# Patient Record
Sex: Female | Born: 1960 | Race: White | Hispanic: No | Marital: Single | Smoking: Former smoker
Health system: Southern US, Community
[De-identification: ages and names within clinical notes are randomized; demographics above are authoritative.]

## PROBLEM LIST (undated history)

## (undated) DIAGNOSIS — Z789 Other specified health status: Secondary | ICD-10-CM

## (undated) DIAGNOSIS — Z720 Tobacco use: Secondary | ICD-10-CM

## (undated) DIAGNOSIS — R079 Chest pain, unspecified: Secondary | ICD-10-CM

## (undated) HISTORY — DX: Tobacco use: Z72.0

## (undated) HISTORY — DX: Other specified health status: Z78.9

## (undated) HISTORY — DX: Chest pain, unspecified: R07.9

---

## 2008-03-20 ENCOUNTER — Emergency Department: Payer: Self-pay | Admitting: Emergency Medicine

## 2008-08-24 ENCOUNTER — Ambulatory Visit: Payer: Self-pay

## 2009-10-24 ENCOUNTER — Ambulatory Visit: Payer: Self-pay

## 2014-02-20 ENCOUNTER — Emergency Department: Payer: Self-pay | Admitting: Student

## 2014-04-01 ENCOUNTER — Other Ambulatory Visit (HOSPITAL_COMMUNITY): Payer: Self-pay | Admitting: Family Medicine

## 2014-04-01 ENCOUNTER — Ambulatory Visit (HOSPITAL_COMMUNITY)
Admission: RE | Admit: 2014-04-01 | Discharge: 2014-04-01 | Disposition: A | Discharge status: Home / Self Care | Payer: Disability Insurance | Source: Ambulatory Visit | Attending: Family Medicine | Admitting: Family Medicine

## 2014-04-01 DIAGNOSIS — M549 Dorsalgia, unspecified: Secondary | ICD-10-CM | POA: Diagnosis present

## 2014-04-01 DIAGNOSIS — M5136 Other intervertebral disc degeneration, lumbar region: Secondary | ICD-10-CM | POA: Insufficient documentation

## 2014-08-28 ENCOUNTER — Emergency Department
Admit: 2014-08-28 | Disposition: A | Discharge status: Home / Self Care | Payer: Self-pay | Admitting: Emergency Medicine

## 2016-03-27 IMAGING — CR DG LUMBAR SPINE 2-3V
3 series · 3 of 3 positions shown · non-contrast
Comparison: None.

CLINICAL DATA: Bilateral back pain, unspecified location.

EXAM:
LUMBAR SPINE - 2-3 VIEW

[view not recorded (1 of 3)]
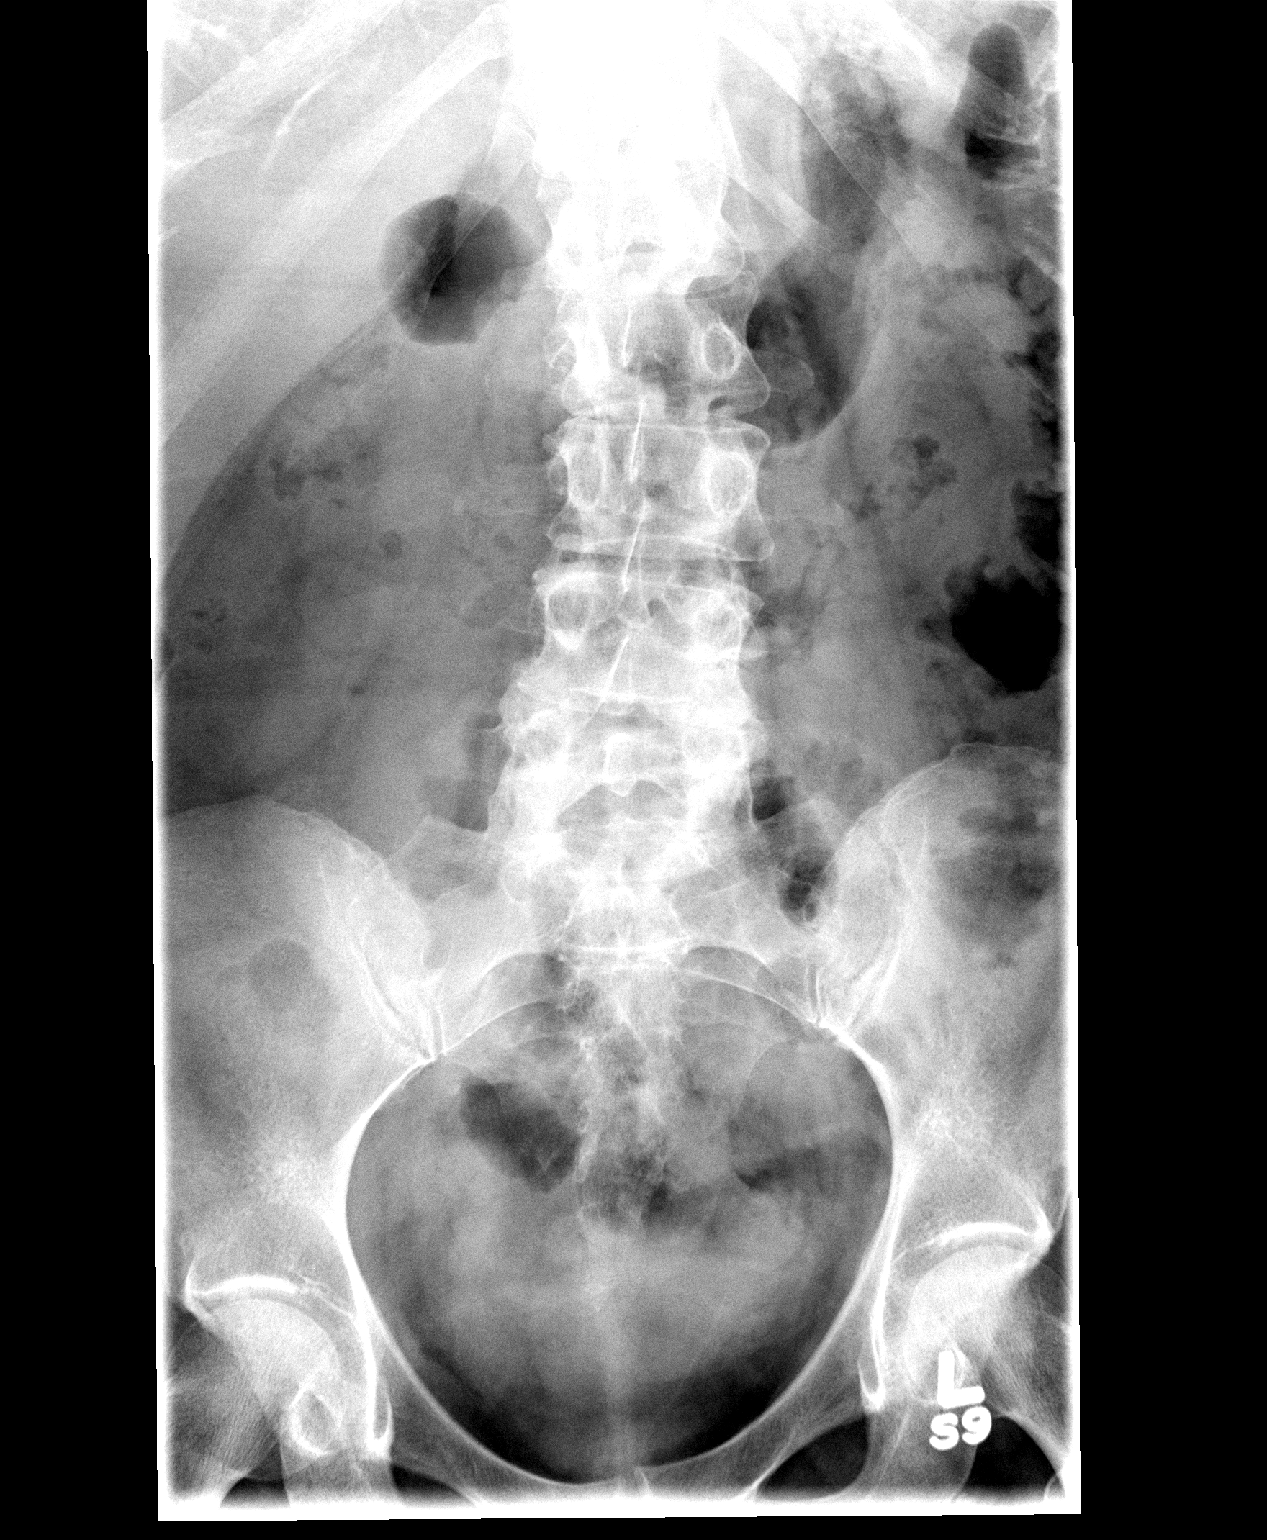

[view not recorded (2 of 3)]
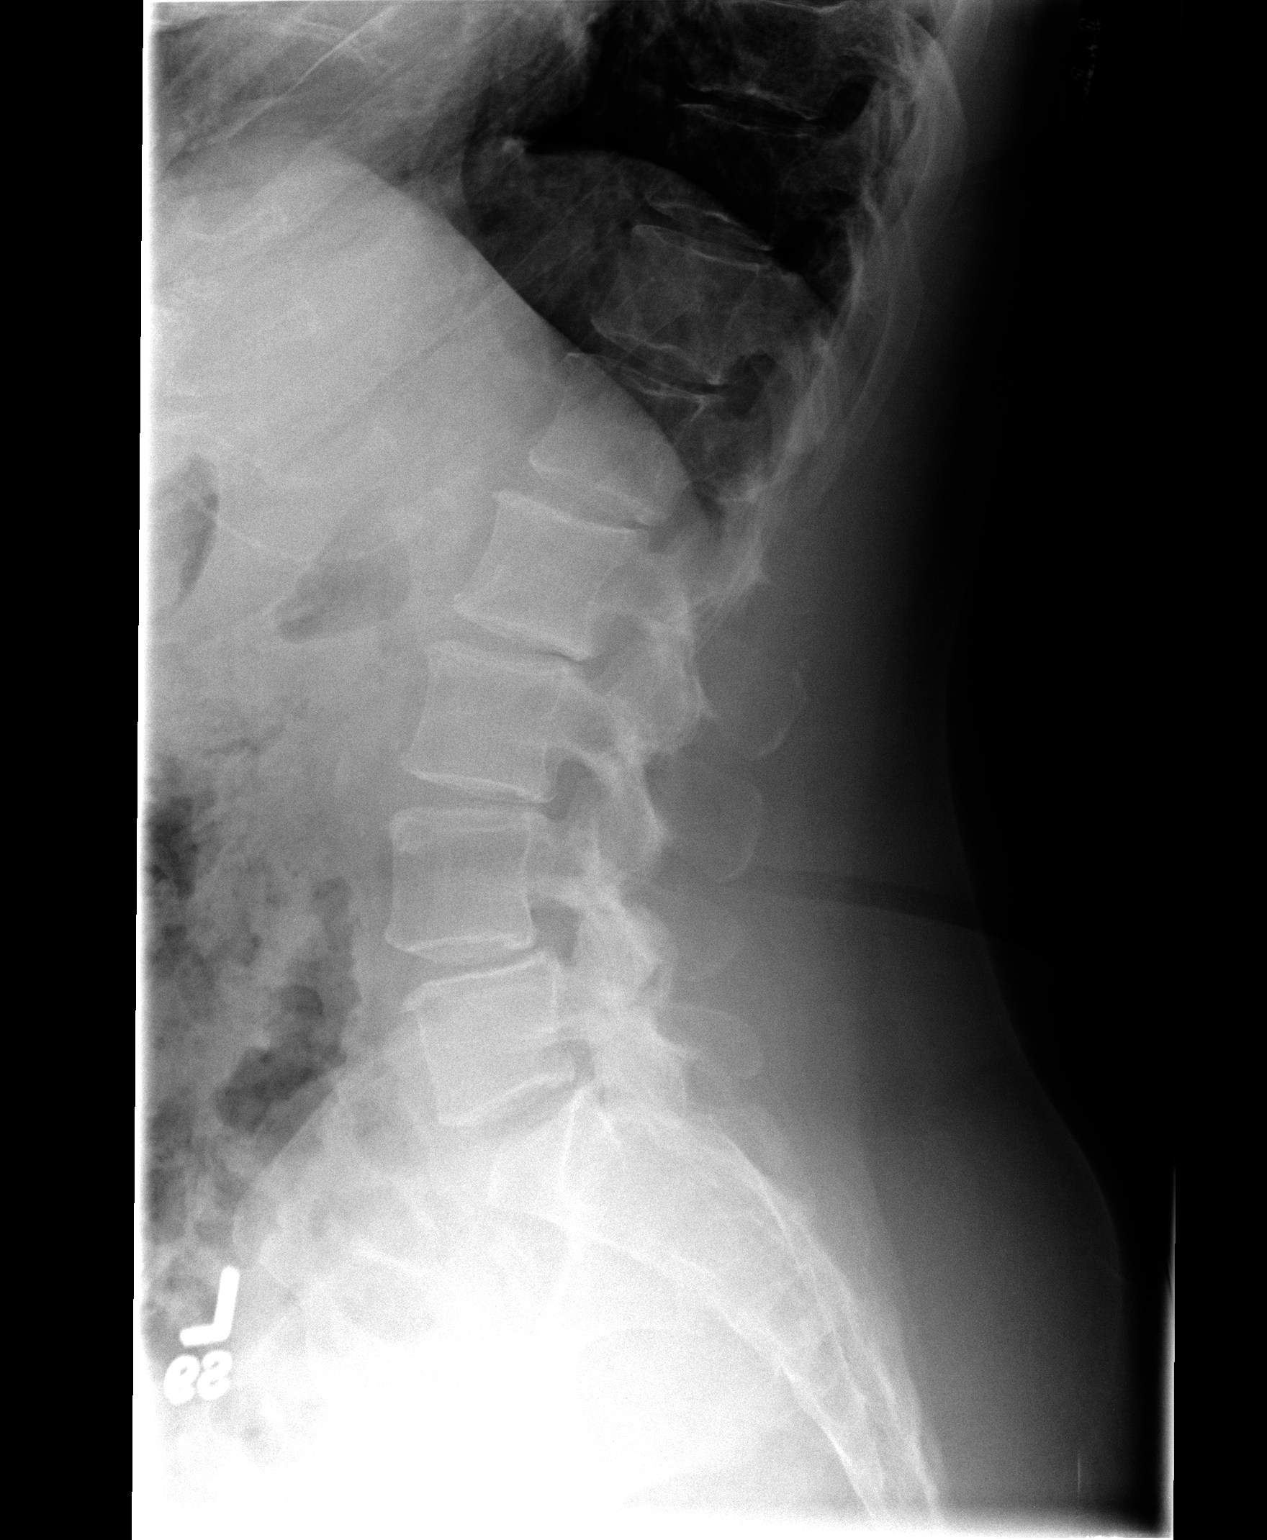

[view not recorded (3 of 3)]
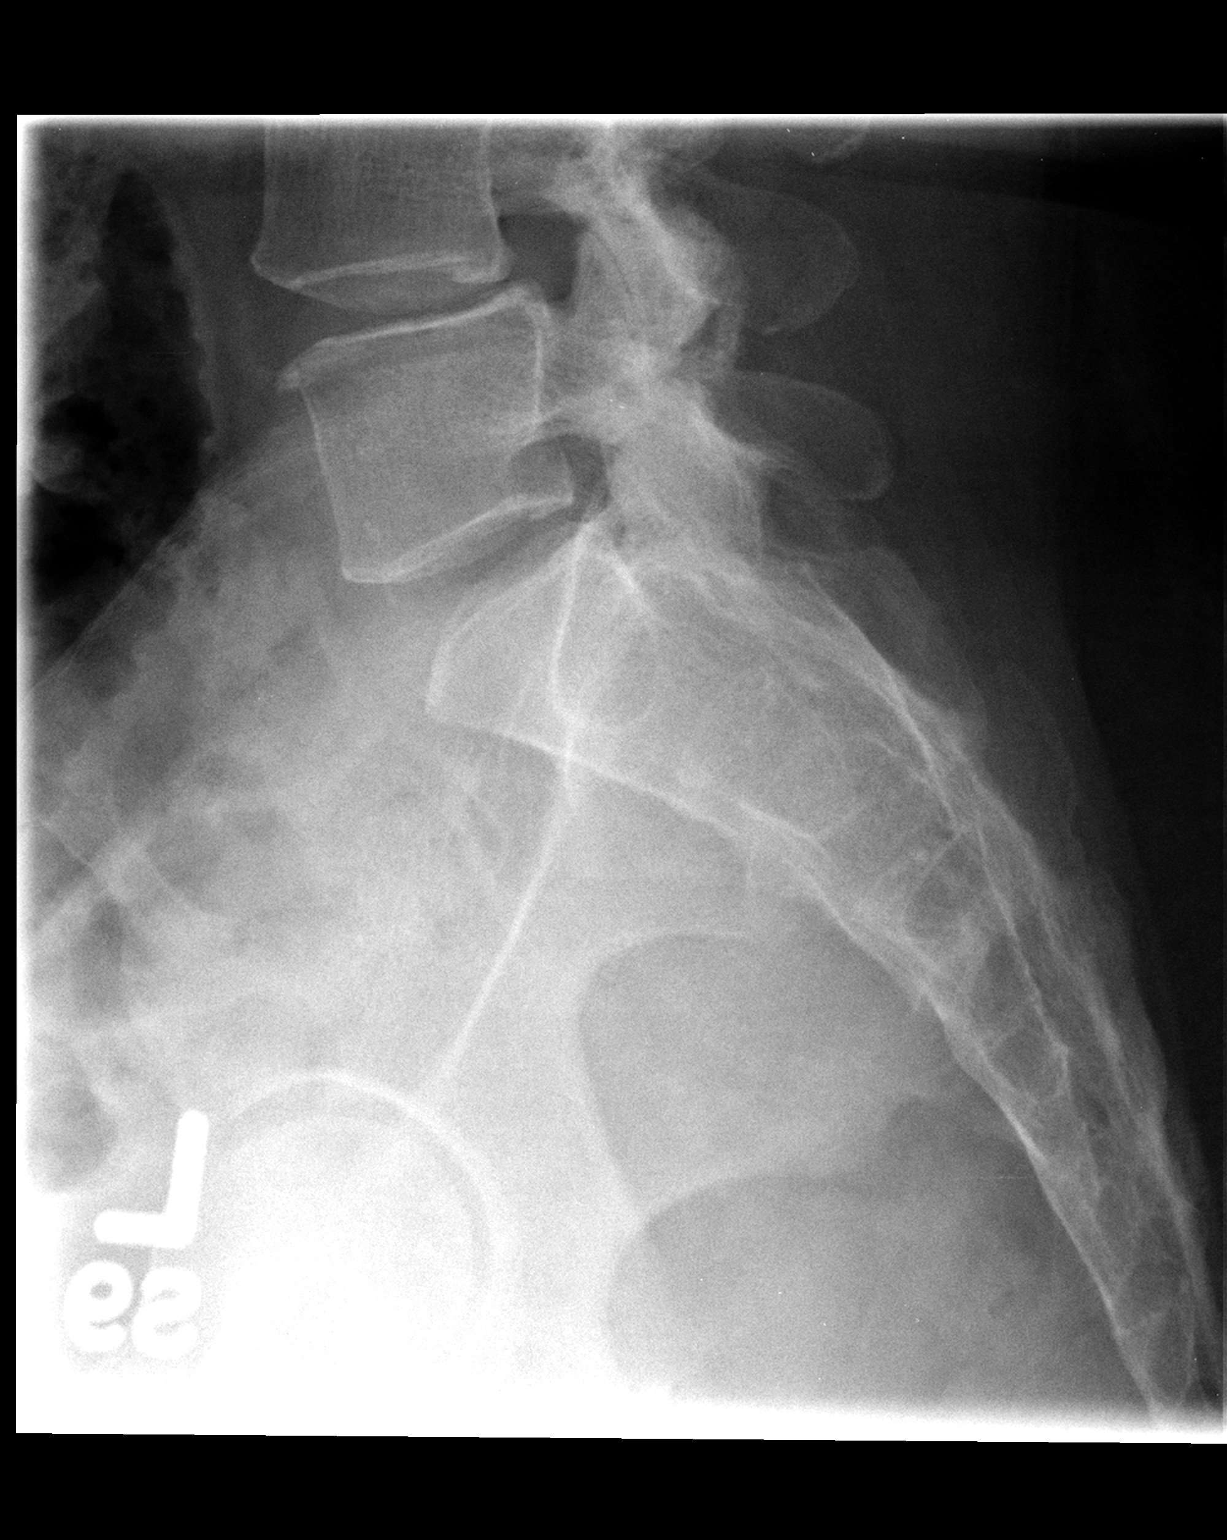

[3 of 3 positions shown; findings below may reference images not displayed]

FINDINGS: Moderate levoscoliosis of lumbar spine is noted. No fracture is
noted. Mild degenerative disc disease is noted at L2-3, L3-4 and
L4-5. Minimal grade 1 anterolisthesis of L4-5 is noted secondary to
posterior facet joint hypertrophy.
IMPRESSION: Mild multilevel degenerative disc disease. No acute abnormality seen
in the lumbar spine.

## 2019-03-29 ENCOUNTER — Emergency Department: Payer: Self-pay

## 2019-03-29 ENCOUNTER — Encounter: Payer: Self-pay | Admitting: Emergency Medicine

## 2019-03-29 ENCOUNTER — Emergency Department
Admission: EM | Admit: 2019-03-29 | Discharge: 2019-03-29 | Disposition: A | Discharge status: Home / Self Care | Payer: Self-pay | Attending: Emergency Medicine | Admitting: Emergency Medicine

## 2019-03-29 ENCOUNTER — Other Ambulatory Visit: Payer: Self-pay

## 2019-03-29 DIAGNOSIS — R0789 Other chest pain: Secondary | ICD-10-CM | POA: Insufficient documentation

## 2019-03-29 DIAGNOSIS — F17290 Nicotine dependence, other tobacco product, uncomplicated: Secondary | ICD-10-CM | POA: Insufficient documentation

## 2019-03-29 LAB — CBC
HCT: 42.9 % (ref 36.0–46.0)
Hemoglobin: 14.2 g/dL (ref 12.0–15.0)
MCH: 29.8 pg (ref 26.0–34.0)
MCHC: 33.1 g/dL (ref 30.0–36.0)
MCV: 90.1 fL (ref 80.0–100.0)
Platelets: 265 10*3/uL (ref 150–400)
RBC: 4.76 MIL/uL (ref 3.87–5.11)
RDW: 12.5 % (ref 11.5–15.5)
WBC: 8.5 10*3/uL (ref 4.0–10.5)
nRBC: 0 % (ref 0.0–0.2)

## 2019-03-29 LAB — BASIC METABOLIC PANEL
Anion gap: 13 (ref 5–15)
BUN: 28 mg/dL — ABNORMAL HIGH (ref 6–20)
CO2: 26 mmol/L (ref 22–32)
Calcium: 9.5 mg/dL (ref 8.9–10.3)
Chloride: 102 mmol/L (ref 98–111)
Creatinine, Ser: 0.91 mg/dL (ref 0.44–1.00)
GFR calc Af Amer: >60 mL/min (ref >60)
GFR calc non Af Amer: >60 mL/min (ref >60)
Glucose, Bld: 139 mg/dL — ABNORMAL HIGH (ref 70–99)
Potassium: 3.8 mmol/L (ref 3.5–5.1)
Sodium: 141 mmol/L (ref 135–145)

## 2019-03-29 LAB — TROPONIN I (HIGH SENSITIVITY): Troponin I (High Sensitivity): <2 ng/L (ref <18)

## 2019-03-29 MED ORDER — SODIUM CHLORIDE 0.9% FLUSH: 3.0000 mL | Freq: Once | INTRAVENOUS | Status: DC

## 2019-03-29 NOTE — ED Notes (Signed)
ED Provider at bedside. 

## 2019-03-29 NOTE — ED Triage Notes (Signed)
C/O left sided chest pain, intermittent pain.  X 1 day.  Describes pain as a grabbing pain.  AAOx3.  Skin warm and dry.  No SOB/ DOE.  NAD

## 2019-03-29 NOTE — ED Provider Notes (Signed)
[facility] Emergency Department Provider Note   ____________________________________________    I have reviewed the triage vital signs and the nursing notes.   HISTORY  Chief Complaint Chest Pain     HPI Nancy Harrington is a 58 y.o. female who presents with complaints of chest pain.  Patient describes 1 week of intermittent chest discomfort in her left superior anterior chest near her shoulder.  She describes it as a pinching pain.  No pleurisy.  No shortness of breath.  No cough fevers or chills.  Has never had this before.  No history of heart disease, did smoke in the past.  Has not take anything for this  History reviewed. No pertinent past medical history.  There are no active problems to display for this patient.   History reviewed. No pertinent surgical history.  Prior to Admission medications   Not on File     Allergies Patient has no known allergies.  No family history on file.  Social History Social History   Tobacco Use  . Smoking status: Current Every Day Smoker    Types: E-cigarettes  . Smokeless tobacco: Never Used  Substance Use Topics  . Alcohol use: Not Currently  . Drug use: Not Currently    Review of Systems  Constitutional: No fever/chills Eyes: No visual changes.  ENT: No sore throat. Cardiovascular: As above Respiratory: Denies shortness of breath. Gastrointestinal: No abdominal pain.   Genitourinary: Negative for dysuria. Musculoskeletal: Negative for back pain. Skin: Negative for rash. Neurological: Negative for headaches or weakness   ____________________________________________   PHYSICAL EXAM:  VITAL SIGNS: ED Triage Vitals  Enc Vitals Group     BP 03/29/19 1825 (!) 142/85     Pulse Rate 03/29/19 1825 81     Resp 03/29/19 1825 16     Temp 03/29/19 1825 98.1 F (36.7 C)     Temp Source 03/29/19 1825 Oral     SpO2 03/29/19 1825 97 %     Weight 03/29/19 1822 68 kg (150 lb)     Height  03/29/19 1822 1.549 m (5\' 1" )     Head Circumference --      Peak Flow --      Pain Score 03/29/19 1821 5     Pain Loc --      Pain Edu? --      Excl. in [place]? --     Constitutional: Alert and oriented.  Mouth/Throat: Mucous membranes are moist.   Neck:  Painless ROM Cardiovascular: Normal rate, regular rhythm. Grossly normal heart sounds.  Good peripheral circulation.  Mild chest wall tenderness to palpation, equal pulses upper extremities Respiratory: Normal respiratory effort.  No retractions. Lungs CTAB. Gastrointestinal: Soft and nontender. No distention.  No CVA tenderness. Genitourinary: deferred Musculoskeletal: No lower extremity tenderness nor edema.  Warm and well perfused Neurologic:  Normal speech and language. No gross focal neurologic deficits are appreciated.  Skin:  Skin is warm, dry and intact. No rash noted. Psychiatric: Mood and affect are normal. Speech and behavior are normal.  ____________________________________________   LABS (all labs ordered are listed, but only abnormal results are displayed)  Labs Reviewed  BASIC METABOLIC PANEL - Abnormal; Notable for the following components:      Result Value   Glucose, Bld 139 (*)    BUN 28 (*)    All other components within normal limits  CBC  POC URINE PREG, ED  TROPONIN I (HIGH SENSITIVITY)   ____________________________________________  EKG  ED ECG REPORT I, Lavonia Drafts, the attending physician, personally viewed and interpreted this ECG.  Date: 03/29/2019  Rhythm: normal sinus rhythm QRS Axis: normal Intervals: normal ST/T Wave abnormalities: normal Narrative Interpretation: no evidence of acute ischemia  ____________________________________________  RADIOLOGY  Chest x-ray unremarkable ____________________________________________   PROCEDURES  Procedure(s) performed: No  Procedures   Critical Care performed: No ____________________________________________   INITIAL IMPRESSION  / ASSESSMENT AND PLAN / ED COURSE  Pertinent labs & imaging results that were available during my care of the patient were reviewed by me and considered in my medical decision making (see chart for details).  Patient presents with chest discomfort as described above, no pain currently.  EKG is reassuring.  Troponin is normal.  Lab work is unremarkable.  Chest x-ray is benign.  Symptoms not consistent with ACS PE or dissection.  She is asymptomatic currently.  Appropriate for discharge at this time we will have her follow-up with cardiology.    ____________________________________________   FINAL CLINICAL IMPRESSION(S) / ED DIAGNOSES  Final diagnoses:  Atypical chest pain        Note:  This document was prepared using Dragon voice recognition software and may include unintentional dictation errors.   Lavonia Drafts, MD 03/29/19 2059

## 2019-04-05 ENCOUNTER — Encounter: Payer: Self-pay | Admitting: Cardiology

## 2019-04-05 ENCOUNTER — Ambulatory Visit (INDEPENDENT_AMBULATORY_CARE_PROVIDER_SITE_OTHER): Payer: Self-pay | Admitting: Cardiology

## 2019-04-05 ENCOUNTER — Other Ambulatory Visit: Payer: Self-pay

## 2019-04-05 VITALS — BP 120/80 | HR 82 | Temp 97.4°F | Ht 61.0 in | Wt 152.2 lb

## 2019-04-05 DIAGNOSIS — R079 Chest pain, unspecified: Secondary | ICD-10-CM

## 2019-04-05 NOTE — Progress Notes (Signed)
Cardiology Office Note:    Date:  04/05/2019   ID:  Nancy Harrington, DOB [birth year], MRN [id number]  PCP:  System, Provider Not In  Cardiologist:  Kate Sable, MD  Electrophysiologist:  None   Referring MD: Lavonia Drafts, MD   Chief Complaint  Patient presents with   New Patient (Initial Visit)    Intermittent chest pain-Patient reports restless legs; No meds.    History of Present Illness:    Nancy Harrington is a 58 y.o. female with tobacco smoker x30 years, currently uses e-cigarettes, who presents as a follow-up from the emergency department due to chest pain.  Patient was seen in the emergency room about a week ago for symptoms of chest pain.  Pain has been ongoing for 2 weeks now and she describes it astightness in her left chest.  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  She went home thinking the pain will improve and it slowly is still way and she went to sleep.  When she woke up she denies any symptoms of chest pain while she was getting ready to go to work, the symptoms of left chest discomfort resumed which prompted her to go to the emergency room.  She denies any shortness of breath, palpitations, orthopnea, presyncope or syncope.  In the emergency room, chest x-ray was unremarkable, EKG was normal.  High-sensitivity troponin was normal.  Patient was then advised to follow-up with cardiology.  She states she has not really experienced the symptoms since.  Patient has not seen a physician in about 7 years now.  She is not sure if she has diabetes or hyperlipidemia.  History reviewed. No pertinent past medical history.  Past Surgical History:  Procedure Laterality Date   CESAREAN SECTION      Current Medications: No outpatient medications have been marked as taking for the 04/05/19 encounter (Office Visit) with Kate Sable, MD.     Allergies:   Patient has no known allergies.   Social History   Socioeconomic History   Marital  status: Married    Spouse name: Not on file   Number of children: Not on file   Years of education: Not on file   Highest education level: Not on file  Occupational History   Not on file  Social Needs   Financial resource strain: Not on file   Food insecurity    Worry: Not on file    Inability: Not on file   Transportation needs    Medical: Not on file    Non-medical: Not on file  Tobacco Use   Smoking status: Former Smoker    Types: E-cigarettes, Cigarettes   Smokeless tobacco: Never Used  Substance and Sexual Activity   Alcohol use: Not Currently   Drug use: Not Currently   Sexual activity: Not on file  Lifestyle   Physical activity    Days per week: Not on file    Minutes per session: Not on file   Stress: Not on file  Relationships   Social connections    Talks on phone: Not on file    Gets together: Not on file    Attends religious service: Not on file    Active member of club or organization: Not on file    Attends meetings of clubs or organizations: Not on file    Relationship status: Not on file  Other Topics Concern   Not on file  Social History Narrative   Not on file  Family History: The patient's family history includes Diabetes in her mother; Heart disease in her mother; Hypertension in her brother and father.  ROS:   Please see the history of present illness.     All other systems reviewed and are negative.  EKGs/Labs/Other Studies Reviewed:    The following studies were reviewed today:   EKG:  EKG is  ordered today.  The ekg ordered today demonstrates sinus rhythm, cannot rule out anterior infarct.  Recent Labs: 03/29/2019: BUN 28; Creatinine, Ser 0.91; Hemoglobin 14.2; Platelets 265; Potassium 3.8; Sodium 141  Recent Lipid Panel No results found for: CHOL, TRIG, HDL, CHOLHDL, VLDL, LDLCALC, LDLDIRECT  Physical Exam:    VS:  BP 120/80 (BP Location: Right Arm, Patient Position: Sitting, Cuff Size: Normal)    Pulse 82     Temp (!) 97.4 F (36.3 C)    Ht 5\' 1"  (1.549 m)    Wt 152 lb 4 oz (69.1 kg)    SpO2 97%    BMI 28.77 kg/m     Wt Readings from Last 3 Encounters:  04/05/19 152 lb 4 oz (69.1 kg)  03/29/19 150 lb (68 kg)     GEN:  Well nourished, well developed in no acute distress HEENT: Normal NECK: No JVD; No carotid bruits LYMPHATICS: No lymphadenopathy CARDIAC: RRR, no murmurs, rubs, gallops RESPIRATORY:  Clear to auscultation without rales, wheezing or rhonchi  ABDOMEN: Soft, non-tender, non-distended MUSCULOSKELETAL:  No edema; No deformity  SKIN: Warm and dry NEUROLOGIC:  Alert and oriented x 3 PSYCHIATRIC:  Normal affect   ASSESSMENT:   Patient with atypical chest pain.  Has a long history of smoking.  Has not seen a physician in about 7 years.  Other risk factors of diabetes or hyperlipidemia indeterminate. 1. Chest pain, unspecified type    PLAN:    In order of problems listed above:  1. We will get a Lexiscan myocardial perfusion imaging stress test.  Follow-up after Lexiscan.  Total encounter time more than 45 minutes  Greater than 50% was spent in counseling and coordination of care with the patient  This note was generated in part or whole with voice recognition software. Voice recognition is usually quite accurate but there are transcription errors that can and very often do occur. I apologize for any typographical errors that were not detected and corrected.  Medication Adjustments/Labs and Tests Ordered: Current medicines are reviewed at length with the patient today.  Concerns regarding medicines are outlined above.  Orders Placed This Encounter  Procedures   NM Myocar Multi W/Spect W/Wall Motion / EF   Pregnancy, urine   EKG 12-Lead   No orders of the defined types were placed in this encounter.   There are no Patient Instructions on file for this visit.   Signed, Kate Sable, MD  04/05/2019 4:46 PM    [place]

## 2019-04-05 NOTE — Addendum Note (Signed)
Addended by: Vanessa Ralphs on: 04/05/2019 05:13 PM   Modules accepted: Orders

## 2019-04-05 NOTE — Patient Instructions (Addendum)
Medication Instructions:  Your physician recommends that you continue on your current medications as directed. Please refer to the Current Medication list given to you today.  *If you need a refill on your cardiac medications before your next appointment, please call your pharmacy*  Lab Work: none If you have labs (blood work) drawn today and your tests are completely normal, you will receive your results only by: Marland Kitchen MyChart Message (if you have MyChart) OR . A paper copy in the mail If you have any lab test that is abnormal or we need to change your treatment, we will call you to review the results.  Testing/Procedures: Your physician has requested that you have a lexiscan myoview. For further information please visit [email or web address]. Please follow instruction sheet, as given.  [place]  Your caregiver has ordered a Stress Test with nuclear imaging. The purpose of this test is to evaluate the blood supply to your heart muscle. This procedure is referred to as a "Non-Invasive Stress Test." This is because other than having an IV started in your vein, nothing is inserted or "invades" your body. Cardiac stress tests are done to find areas of poor blood flow to the heart by determining the extent of coronary artery disease (CAD). Some patients exercise on a treadmill, which naturally increases the blood flow to your heart, while others who are  unable to walk on a treadmill due to physical limitations have a pharmacologic/chemical stress agent called Lexiscan . This medicine will mimic walking on a treadmill by temporarily increasing your coronary blood flow.   Please note: these test may take anywhere between 2-4 hours to complete  PLEASE REPORT TO [place] AT THE FIRST DESK WILL DIRECT YOU WHERE TO GO  Date of Procedure:_____________________________________  Arrival Time for Procedure:______________________________   PLEASE NOTIFY THE OFFICE AT  LEAST 24 HOURS IN ADVANCE IF YOU ARE UNABLE TO KEEP YOUR APPOINTMENT.  [phone] AND  PLEASE NOTIFY NUCLEAR MEDICINE AT [facility] AT LEAST 24 HOURS IN ADVANCE IF YOU ARE UNABLE TO KEEP YOUR APPOINTMENT. [phone]  How to prepare for your Myoview test:  1. Do not eat or drink after midnight 2. No caffeine for 24 hours prior to test 3. No smoking 24 hours prior to test. 4. Your medication may be taken with water.  If your doctor stopped a medication because of this test, do not take that medication. 5. Ladies, please do not wear dresses.  Skirts or pants are appropriate. Please wear a short sleeve shirt. 6. No perfume, cologne or lotion. 7. Wear comfortable walking shoes. No heels!  Follow-Up: At [facility], you and your health needs are our priority.  As part of our continuing mission to provide you with exceptional heart care, we have created designated Provider Care Teams.  These Care Teams include your primary Cardiologist (physician) and Advanced Practice Providers (APPs -  Physician Assistants and Nurse Practitioners) who all work together to provide you with the care you need, when you need it.  Your next appointment:   4-5 weeks.  The format for your next appointment:   In Person  Provider:    You may see Kate Sable, MD or one of the following Advanced Practice Providers on your designated Care Team:    Murray Hodgkins, NP  Christell Faith, PA-C  Marrianne Mood, PA-C   Cardiac Nuclear Scan A cardiac nuclear scan is a test that measures blood flow to the heart when a person is resting and  when he or she is exercising. The test looks for problems such as:  Not enough blood reaching a portion of the heart.  The heart muscle not working normally. You may need this test if:  You have heart disease.  You have had abnormal lab results.  You have had heart surgery or a balloon procedure to open up blocked arteries (angioplasty).  You have chest pain.  You  have shortness of breath. In this test, a radioactive dye (tracer) is injected into your bloodstream. After the tracer has traveled to your heart, an imaging device is used to measure how much of the tracer is absorbed by or distributed to various areas of your heart. This procedure is usually done at a hospital and takes 2-4 hours. Tell a health care provider about:  Any allergies you have.  All medicines you are taking, including vitamins, herbs, eye drops, creams, and over-the-counter medicines.  Any problems you or family members have had with anesthetic medicines.  Any blood disorders you have.  Any surgeries you have had.  Any medical conditions you have.  Whether you are pregnant or may be pregnant. What are the risks? Generally, this is a safe procedure. However, problems may occur, including:  Serious chest pain and heart attack. This is only a risk if the stress portion of the test is done.  Rapid heartbeat.  Sensation of warmth in your chest. This usually passes quickly.  Allergic reaction to the tracer. What happens before the procedure?  Ask your health care provider about changing or stopping your regular medicines. This is especially important if you are taking diabetes medicines or blood thinners.  Follow instructions from your health care provider about eating or drinking restrictions.  Remove your jewelry on the day of the procedure. What happens during the procedure?  An IV will be inserted into one of your veins.  Your health care provider will inject a small amount of radioactive tracer through the IV.  You will wait for 20-40 minutes while the tracer travels through your bloodstream.  Your heart activity will be monitored with an electrocardiogram (ECG).  You will lie down on an exam table.  Images of your heart will be taken for about 15-20 minutes.  You may also have a stress test. For this test, one of the following may be done: ? You will  exercise on a treadmill or stationary bike. While you exercise, your heart's activity will be monitored with an ECG, and your blood pressure will be checked. ? You will be given medicines that will increase blood flow to parts of your heart. This is done if you are unable to exercise.  When blood flow to your heart has peaked, a tracer will again be injected through the IV.  After 20-40 minutes, you will get back on the exam table and have more images taken of your heart.  Depending on the type of tracer used, scans may need to be repeated 3-4 hours later.  Your IV line will be removed when the procedure is over. The procedure may vary among health care providers and hospitals. What happens after the procedure?  Unless your health care provider tells you otherwise, you may return to your normal schedule, including diet, activities, and medicines.  Unless your health care provider tells you otherwise, you may increase your fluid intake. This will help to flush the contrast dye from your body. Drink enough fluid to keep your urine pale yellow.  Ask your health care  provider, or the department that is doing the test: ? When will my results be ready? ? How will I get my results? Summary  A cardiac nuclear scan measures the blood flow to the heart when a person is resting and when he or she is exercising.  Tell your health care provider if you are pregnant.  Before the procedure, ask your health care provider about changing or stopping your regular medicines. This is especially important if you are taking diabetes medicines or blood thinners.  After the procedure, unless your health care provider tells you otherwise, increase your fluid intake. This will help flush the contrast dye from your body.  After the procedure, unless your health care provider tells you otherwise, you may return to your normal schedule, including diet, activities, and medicines. This information is not intended to  replace advice given to you by your health care provider. Make sure you discuss any questions you have with your health care provider. Document Released: 06/07/2004 Document Revised: 10/27/2017 Document Reviewed: 10/27/2017 Elsevier Patient Education  2020 [employer].

## 2019-04-13 ENCOUNTER — Encounter
Admission: RE | Admit: 2019-04-13 | Discharge: 2019-04-13 | Disposition: A | Discharge status: Home / Self Care | Payer: Self-pay | Source: Ambulatory Visit | Attending: Cardiology | Admitting: Cardiology

## 2019-04-13 ENCOUNTER — Other Ambulatory Visit: Payer: Self-pay

## 2019-04-13 DIAGNOSIS — R079 Chest pain, unspecified: Secondary | ICD-10-CM

## 2019-04-13 MED ORDER — TECHNETIUM TC 99M TETROFOSMIN IV KIT
10.0000 | PACK | Freq: Once | INTRAVENOUS | Status: AC | PRN
  Administered 2019-04-13: 10.222 via INTRAVENOUS

## 2019-04-13 MED ORDER — REGADENOSON 0.4 MG/5ML IV SOLN
0.4000 mg | Freq: Once | INTRAVENOUS | Status: AC
  Administered 2019-04-13: 0.4 mg via INTRAVENOUS

## 2019-04-13 MED ORDER — TECHNETIUM TC 99M TETROFOSMIN IV KIT
30.0000 | PACK | Freq: Once | INTRAVENOUS | Status: AC | PRN
  Administered 2019-04-13: 30.821 via INTRAVENOUS

## 2019-04-14 LAB — NM MYOCAR MULTI W/SPECT W/WALL MOTION / EF
LV dias vol: 39 mL (ref 46–106)
LV sys vol: 11 mL
MPHR: 161 {beats}/min
Peak HR: 108 {beats}/min
Percent HR: 67 %
Rest HR: 61 {beats}/min
TID: 0.94

## 2019-04-15 ENCOUNTER — Telehealth: Payer: Self-pay

## 2019-04-15 NOTE — Telephone Encounter (Signed)
Called to give the patient stress test results. Unable to lmom patients voiced mail is full.

## 2019-04-19 NOTE — Telephone Encounter (Signed)
2nd attempt to contact the patient. Unable to lmom. Patients voicemail is full.

## 2019-04-20 NOTE — Telephone Encounter (Signed)
-----   Message from Kate Sable, MD sent at 04/15/2019  4:11 PM EST ----- Normal test.

## 2019-04-20 NOTE — Telephone Encounter (Signed)
3rd attempt to contact the patient with results. Unable to lmom. Letter mailed for the patient to contact the office for stress test results.

## 2019-05-03 NOTE — Telephone Encounter (Signed)
Incoming call from patient to review results of stress test. No further orders or questions at this time.   Pt asked to delay appt d/t work schedule. Changed appt to 16th.   Advised pt to call for any further questions or concerns.

## 2019-05-11 ENCOUNTER — Ambulatory Visit: Payer: Self-pay | Admitting: Cardiology

## 2019-05-12 ENCOUNTER — Ambulatory Visit: Payer: Self-pay | Admitting: Nurse Practitioner

## 2019-06-09 ENCOUNTER — Encounter: Payer: Self-pay | Admitting: Nurse Practitioner

## 2019-06-09 ENCOUNTER — Ambulatory Visit (INDEPENDENT_AMBULATORY_CARE_PROVIDER_SITE_OTHER): Payer: Self-pay | Admitting: Nurse Practitioner

## 2019-06-09 ENCOUNTER — Other Ambulatory Visit: Payer: Self-pay

## 2019-06-09 VITALS — BP 138/88 | HR 77 | Ht 61.0 in | Wt 153.2 lb

## 2019-06-09 DIAGNOSIS — R0789 Other chest pain: Secondary | ICD-10-CM

## 2019-06-09 DIAGNOSIS — R030 Elevated blood-pressure reading, without diagnosis of hypertension: Secondary | ICD-10-CM

## 2019-06-09 NOTE — Patient Instructions (Signed)
Medication Instructions:  None ordered  *If you need a refill on your cardiac medications before your next appointment, please call your pharmacy*  Lab Work: None ordered  If you have labs (blood work) drawn today and your tests are completely normal, you will receive your results only by: Marland Kitchen MyChart Message (if you have MyChart) OR . A paper copy in the mail If you have any lab test that is abnormal or we need to change your treatment, we will call you to review the results.  Testing/Procedures: None ordered   Follow-Up: At [facility], you and your health needs are our priority.  As part of our continuing mission to provide you with exceptional heart care, we have created designated Provider Care Teams.  These Care Teams include your primary Cardiologist (physician) and Advanced Practice Providers (APPs -  Physician Assistants and Nurse Practitioners) who all work together to provide you with the care you need, when you need it.  Your next appointment:  As needed

## 2019-06-09 NOTE — Progress Notes (Signed)
Office Visit    Patient Name: Nancy Harrington Date of Encounter: 06/09/2019  Primary Care Provider:  System, Provider Not In Primary Cardiologist:  Kate Sable, MD  Chief Complaint    59 year old female with a history of chest pain and tobacco abuse, who presents for follow-up after recent normal stress test.  Past Medical History    Past Medical History:  Diagnosis Date  . Chest pain    a. 03/2019 MV: EF >65%. No ischemia/infarct. No significant cor Ca2+.  . Electronic cigarette use   . Tobacco abuse    Past Surgical History:  Procedure Laterality Date  . CESAREAN SECTION      Allergies  No Known Allergies  History of Present Illness    59 year old female with a 30-pack-year history of tobacco abuse currently using e-cigarettes, who was recently evaluated following emergency room visit for chest pain.  Discomfort was described as a tightness in the left chest and was intermittent for 2 weeks at the time of her ER visit.  There, chest x-ray, ECG, and lab work was unremarkable and she was discharged.  She had no recurrent symptoms between her ER visit and cardiology visit on November 9.  Stress testing was subsequently undertaken and was negative for infarct/ischemia, with normal LV function, and without any significant coronary calcium.  Since her last visit, she has not had any recurrent chest pain.  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  She thinks that perhaps anxiety was causing her symptoms previously.  She does not currently have a primary care doctor and is interested in a referral.  She denies dyspnea, palpitations, PND, orthopnea, dizziness, syncope, edema, or early satiety.  Home Medications    None     Review of Systems    She feels anxious.  She denies chest pain, palpitations, dyspnea, pnd, orthopnea, n, v, dizziness, syncope, edema, weight gain, or early satiety.  All  other systems reviewed and are otherwise negative except as noted above.  Physical Exam    VS:  BP 138/88   Pulse 77   Ht 5\' 1"  (1.549 m)   Wt 153 lb 4 oz (69.5 kg)   SpO2 98%   BMI 28.96 kg/m  , BMI Body mass index is 28.96 kg/m. GEN: Well nourished, well developed, in no acute distress. HEENT: normal. Neck: Supple, no JVD, carotid bruits, or masses. Cardiac: RRR, no murmurs, rubs, or gallops. No clubbing, cyanosis, edema.  Radials/DP/PT 2+ and equal bilaterally.  Respiratory:  Respirations regular and unlabored, clear to auscultation bilaterally. GI: Soft, nontender, nondistended, BS + x 4. MS: no deformity or atrophy. Skin: warm and dry, no rash. Neuro:  Strength and sensation are intact. Psych: Normal affect.  Accessory Clinical Findings    ECG personally reviewed by me today -regular sinus rhythm, 77, left axis - no acute changes.  Lab Results  Component Value Date   WBC 8.5 03/29/2019   HGB 14.2 03/29/2019   HCT 42.9 03/29/2019   MCV 90.1 03/29/2019   PLT 265 03/29/2019   Lab Results  Component Value Date   CREATININE 0.91 03/29/2019   BUN 28 (H) 03/29/2019   NA 141 03/29/2019   K 3.8 03/29/2019   CL 102 03/29/2019   CO2 26 03/29/2019    Assessment & Plan    1.  Atypical chest pain: Patient recently evaluated in the emergency department secondary to atypical chest  pain with unremarkable work-up.  She subsequently underwent stress testing which was nonischemic and showed normal LV function.  Also no significant coronary calcium.  She has had no recurrent symptoms since her ER visit.  Reassurance provided.  No further cardiac work-up warranted.  We have provided her with information to obtain a primary care provider.  2.  Elevated blood pressure: Blood pressure mildly elevated today.  She does own a blood pressure cuff and I recommended that she check her blood pressure regularly and document findings for when she follows up with primary care.  She says that when  she was checking regularly in the past pressures were typically in the 1 teens.  3.  Anxiety: Patient says that she feels on edge and anxious at times and thinks that this was likely contributing to chest pain symptoms previously.  As above, referral to primary care.  4.  E-cigarette usage:  Cessation advised.  5.  Disposition: Follow-up with cardiology as needed.   Murray Hodgkins, NP 06/09/2019, 12:25 PM

## 2020-10-25 ENCOUNTER — Other Ambulatory Visit: Payer: Self-pay | Admitting: Family Medicine

## 2020-10-25 DIAGNOSIS — Z1231 Encounter for screening mammogram for malignant neoplasm of breast: Secondary | ICD-10-CM

## 2020-11-07 ENCOUNTER — Encounter: Payer: Self-pay | Admitting: Family Medicine

## 2020-11-09 ENCOUNTER — Other Ambulatory Visit: Payer: Self-pay

## 2020-11-09 DIAGNOSIS — Z1211 Encounter for screening for malignant neoplasm of colon: Secondary | ICD-10-CM

## 2020-11-09 MED ORDER — SUPREP BOWEL PREP KIT 17.5-3.13-1.6 GM/177ML PO SOLN: 1.0000 | ORAL | 0 refills | Status: AC

## 2020-11-20 ENCOUNTER — Encounter: Payer: Self-pay | Admitting: Gastroenterology

## 2020-12-01 ENCOUNTER — Ambulatory Visit: Payer: BC Managed Care – PPO | Admitting: Anesthesiology

## 2020-12-01 ENCOUNTER — Encounter: Payer: Self-pay | Admitting: Gastroenterology

## 2020-12-01 ENCOUNTER — Encounter
Admission: RE | Disposition: A | Discharge status: Home / Self Care | Payer: Self-pay | Source: Home / Self Care | Attending: Gastroenterology

## 2020-12-01 ENCOUNTER — Other Ambulatory Visit: Payer: Self-pay

## 2020-12-01 ENCOUNTER — Ambulatory Visit
Admission: RE | Admit: 2020-12-01 | Discharge: 2020-12-01 | Disposition: A | Discharge status: Home / Self Care | Payer: BC Managed Care – PPO | Attending: Gastroenterology | Admitting: Gastroenterology

## 2020-12-01 DIAGNOSIS — K635 Polyp of colon: Secondary | ICD-10-CM

## 2020-12-01 DIAGNOSIS — K648 Other hemorrhoids: Secondary | ICD-10-CM | POA: Insufficient documentation

## 2020-12-01 DIAGNOSIS — Z1211 Encounter for screening for malignant neoplasm of colon: Secondary | ICD-10-CM | POA: Diagnosis present

## 2020-12-01 DIAGNOSIS — Z87891 Personal history of nicotine dependence: Secondary | ICD-10-CM | POA: Insufficient documentation

## 2020-12-01 DIAGNOSIS — D124 Benign neoplasm of descending colon: Secondary | ICD-10-CM | POA: Insufficient documentation

## 2020-12-01 DIAGNOSIS — Z98891 History of uterine scar from previous surgery: Secondary | ICD-10-CM | POA: Diagnosis not present

## 2020-12-01 HISTORY — PX: COLONOSCOPY WITH PROPOFOL: SHX5780

## 2020-12-01 HISTORY — PX: POLYPECTOMY: SHX5525

## 2020-12-01 SURGERY — COLONOSCOPY WITH PROPOFOL
Anesthesia: General | Site: Rectum

## 2020-12-01 MED ORDER — LACTATED RINGERS IV SOLN: INTRAVENOUS | Status: DC

## 2020-12-01 MED ORDER — PROPOFOL 10 MG/ML IV BOLUS
INTRAVENOUS | Status: DC | PRN
  Administered 2020-12-01: 40 mg via INTRAVENOUS
  Administered 2020-12-01: 50 mg via INTRAVENOUS
  Administered 2020-12-01: 100 mg via INTRAVENOUS
  Administered 2020-12-01: 50 mg via INTRAVENOUS
  Administered 2020-12-01 (×2): 40 mg via INTRAVENOUS
  Administered 2020-12-01: 50 mg via INTRAVENOUS

## 2020-12-01 MED ORDER — ONDANSETRON HCL 4 MG/2ML IJ SOLN: 4.0000 mg | Freq: Once | INTRAMUSCULAR | Status: DC | PRN

## 2020-12-01 MED ORDER — ACETAMINOPHEN 160 MG/5ML PO SOLN: 325.0000 mg | ORAL | Status: DC | PRN

## 2020-12-01 MED ORDER — SODIUM CHLORIDE 0.9 % IV SOLN: INTRAVENOUS | Status: DC

## 2020-12-01 MED ORDER — ACETAMINOPHEN 325 MG PO TABS: 325.0000 mg | ORAL_TABLET | ORAL | Status: DC | PRN

## 2020-12-01 MED ORDER — STERILE WATER FOR IRRIGATION IR SOLN
Status: DC | PRN
  Administered 2020-12-01: .05 mL

## 2020-12-01 SURGICAL SUPPLY — 8 items
GOWN CVR UNV OPN BCK APRN NK (MISCELLANEOUS) ×4 IMPLANT
GOWN ISOL THUMB LOOP REG UNIV (MISCELLANEOUS) ×6
KIT PRC NS LF DISP ENDO (KITS) ×2 IMPLANT
KIT PROCEDURE OLYMPUS (KITS) ×3
MANIFOLD NEPTUNE II (INSTRUMENTS) ×3 IMPLANT
SNARE COLD EXACTO (MISCELLANEOUS) ×3 IMPLANT
TRAP ETRAP POLY (MISCELLANEOUS) ×3 IMPLANT
WATER STERILE IRR 250ML POUR (IV SOLUTION) ×3 IMPLANT

## 2020-12-01 NOTE — Transfer of Care (Signed)
Immediate Anesthesia Transfer of Care Note  Patient: Nancy Harrington  Procedure(s) Performed: COLONOSCOPY WITH PROPOFOL (Rectum) POLYPECTOMY (Rectum)  Patient Location: PACU  Anesthesia Type: General  Level of Consciousness: awake, alert  and patient cooperative  Airway and Oxygen Therapy: Patient Spontanous Breathing and Patient connected to supplemental oxygen  Post-op Assessment: Post-op Vital signs reviewed, Patient's Cardiovascular Status Stable, Respiratory Function Stable, Patent Airway and No signs of Nausea or vomiting  Post-op Vital Signs: Reviewed and stable  Complications: No notable events documented.

## 2020-12-01 NOTE — Op Note (Signed)
[facility] Gastroenterology Patient Name: Nancy Harrington Procedure Date: 12/01/2020 9:34 AM MRN: [id number] Account #: [id number] Date of Birth: 02-Jul-1960 Admit Type: Outpatient Age: 60 Room: [facility] OR ROOM 01 Gender: Female Note Status: Finalized Procedure:             Colonoscopy Indications:           Screening for colorectal malignant neoplasm Providers:             Lucilla Lame MD, MD Medicines:             Propofol per Anesthesia Complications:         No immediate complications. Procedure:             Pre-Anesthesia Assessment:                        - Prior to the procedure, a History and Physical was                         performed, and patient medications and allergies were                         reviewed. The patient's tolerance of previous                         anesthesia was also reviewed. The risks and benefits                         of the procedure and the sedation options and risks                         were discussed with the patient. All questions were                         answered, and informed consent was obtained. Prior                         Anticoagulants: The patient has taken no previous                         anticoagulant or antiplatelet agents. ASA Grade                         Assessment: II - A patient with mild systemic disease.                         After reviewing the risks and benefits, the patient                         was deemed in satisfactory condition to undergo the                         procedure.                        After obtaining informed consent, the colonoscope was                         passed under direct vision. Throughout the procedure,  the patient's blood pressure, pulse, and oxygen                         saturations were monitored continuously. The                         Colonoscope was introduced through the anus and                         advanced to the the cecum,  identified by appendiceal                         orifice and ileocecal valve. The colonoscopy was                         performed without difficulty. The patient tolerated                         the procedure well. The quality of the bowel                         preparation was excellent. Findings:      The perianal and digital rectal examinations were normal.      Two sessile polyps were found in the descending colon. The polyps were 3       to 5 mm in size. These polyps were removed with a cold snare. Resection       and retrieval were complete.      Non-bleeding internal hemorrhoids were found during retroflexion. The       hemorrhoids were Grade II (internal hemorrhoids that prolapse but reduce       spontaneously). Impression:            - Two 3 to 5 mm polyps in the descending colon,                         removed with a cold snare. Resected and retrieved.                        - Non-bleeding internal hemorrhoids. Recommendation:        - Discharge patient to home.                        - Resume previous diet.                        - Continue present medications.                        - Await pathology results.                        - Repeat colonoscopy in 7 years for surveillance                         otherwise in 10 years. Procedure Code(s):     --- Professional ---                        [phone], Colonoscopy, flexible; with removal of  tumor(s), polyp(s), or other lesion(s) by snare                         technique Diagnosis Code(s):     --- Professional ---                        Z12.11, Encounter for screening for malignant neoplasm                         of colon                        K63.5, Polyp of colon CPT copyright 2019 American Medical Association. All rights reserved. The codes documented in this report are preliminary and upon coder review may  be revised to meet current compliance requirements. Lucilla Lame MD, MD 12/01/2020  10:00:42 AM This report has been signed electronically. Number of Addenda: 0 Note Initiated On: 12/01/2020 9:34 AM Scope Withdrawal Time: 0 hours 9 minutes 21 seconds  Total Procedure Duration: 0 hours 13 minutes 42 seconds  Estimated Blood Loss:  Estimated blood loss: none.      [facility]

## 2020-12-01 NOTE — Anesthesia Preprocedure Evaluation (Signed)
Anesthesia Evaluation  Patient identified by MRN, date of birth, ID band Patient awake    Reviewed: Allergy & Precautions, NPO status   Airway Mallampati: II  TM Distance: >3 FB     Dental   Pulmonary former smoker (quit 2018),    Pulmonary exam normal        Cardiovascular negative cardio ROS   Rhythm:Regular Rate:Normal  Stress 2020: -Normal pharmacologic myocardial perfusion stress test without significant ischemia or scar. -The left ventricular ejection fraction is hyperdynamic (>65%). -There is no significant coronary artery calcification.    Neuro/Psych    GI/Hepatic   Endo/Other    Renal/GU      Musculoskeletal   Abdominal   Peds  Hematology   Anesthesia Other Findings   Reproductive/Obstetrics                             Anesthesia Physical Anesthesia Plan  ASA: 2  Anesthesia Plan: General   Post-op Pain Management:    Induction: Intravenous  PONV Risk Score and Plan: Propofol infusion, TIVA and Treatment may vary due to age or medical condition  Airway Management Planned: Natural Airway and Nasal Cannula  Additional Equipment:   Intra-op Plan:   Post-operative Plan:   Informed Consent: I have reviewed the patients History and Physical, chart, labs and discussed the procedure including the risks, benefits and alternatives for the proposed anesthesia with the patient or authorized representative who has indicated his/her understanding and acceptance.       Plan Discussed with: CRNA  Anesthesia Plan Comments:         Anesthesia Quick Evaluation

## 2020-12-01 NOTE — Anesthesia Procedure Notes (Signed)
Date/Time: 12/01/2020 9:40 AM Performed by: Cameron Ali, CRNA Pre-anesthesia Checklist: Patient identified, Emergency Drugs available, Suction available, Timeout performed and Patient being monitored Patient Re-evaluated:Patient Re-evaluated prior to induction Oxygen Delivery Method: Nasal cannula Placement Confirmation: positive ETCO2

## 2020-12-01 NOTE — H&P (Signed)
   Lucilla Lame, MD [facility] [address], [place], [place] [address] Phone: [phone] Fax : [phone]  Primary Care Physician:  System, Provider Not In Primary Gastroenterologist:  Dr. Allen Norris  Pre-Procedure History & Physical: HPI:  Nancy Harrington is a 60 y.o. female is here for a screening colonoscopy.   Past Medical History:  Diagnosis Date   Chest pain    a. 03/2019 MV: EF >65%. No ischemia/infarct. No significant cor Ca2+.   Electronic cigarette use    Tobacco abuse     Past Surgical History:  Procedure Laterality Date   CESAREAN SECTION      Prior to Admission medications   Medication Sig Start Date End Date Taking? Authorizing Provider  acetaminophen (TYLENOL) 500 MG tablet Take 500 mg by mouth every 6 (six) hours as needed.   Yes [provider]  Na Sulfate-K Sulfate-Mg Sulf (SUPREP BOWEL PREP KIT) 17.5-3.13-1.6 GM/177ML SOLN Take 1 kit by mouth as directed. 11/09/20   Lucilla Lame, MD    Allergies as of 11/09/2020   (No Known Allergies)    Family History  Problem Relation Age of Onset   Heart disease Mother    Diabetes Mother    Hypertension Father    Hypertension Brother     Social History   Socioeconomic History   Marital status: Married    Spouse name: Not on file   Number of children: Not on file   Years of education: Not on file   Highest education level: Not on file  Occupational History   Not on file  Tobacco Use   Smoking status: Former    Pack years: 0.00    Types: E-cigarettes, Cigarettes    Quit date: 2018    Years since quitting: 4.5   Smokeless tobacco: Never   Tobacco comments:    Quit regular cigarettes around 2018  Vaping Use   Vaping Use: Every day   Substances: Nicotine, Flavoring   Devices: Mod  Substance and Sexual Activity   Alcohol use: Not Currently   Drug use: Not Currently   Sexual activity: Not on file  Other Topics Concern   Not on file  Social History Narrative   Not on file   Social  Determinants of Health   Financial Resource Strain: Not on file  Food Insecurity: Not on file  Transportation Needs: Not on file  Physical Activity: Not on file  Stress: Not on file  Social Connections: Not on file  Intimate Partner Violence: Not on file    Review of Systems: See HPI, otherwise negative ROS  Physical Exam: BP (!) 143/69   Pulse 74   Temp 97.8 F (36.6 C) (Temporal)   Resp 16   Ht _0  (1.549 m)   Wt 63.5 kg   SpO2 98%   BMI 26.45 kg/m  General:   Alert,  pleasant and cooperative in NAD Head:  Normocephalic and atraumatic. Neck:  Supple; no masses or thyromegaly. Lungs:  Clear throughout to auscultation.    Heart:  Regular rate and rhythm. Abdomen:  Soft, nontender and nondistended. Normal bowel sounds, without guarding, and without rebound.   Neurologic:  Alert and  oriented x4;  grossly normal neurologically.  Impression/Plan: Nancy Harrington is now here to undergo a screening colonoscopy.  Risks, benefits, and alternatives regarding colonoscopy have been reviewed with the patient.  Questions have been answered.  All parties agreeable.

## 2020-12-01 NOTE — Anesthesia Postprocedure Evaluation (Signed)
Anesthesia Post Note  Patient: Nancy Harrington  Procedure(s) Performed: COLONOSCOPY WITH PROPOFOL (Rectum) POLYPECTOMY (Rectum)     Patient location during evaluation: PACU Anesthesia Type: General Level of consciousness: awake Pain management: pain level controlled Vital Signs Assessment: post-procedure vital signs reviewed and stable Respiratory status: respiratory function stable Cardiovascular status: stable Postop Assessment: no signs of nausea or vomiting Anesthetic complications: no   No notable events documented.  Veda Canning

## 2020-12-04 ENCOUNTER — Encounter: Payer: Self-pay | Admitting: Gastroenterology

## 2020-12-05 ENCOUNTER — Encounter: Payer: Self-pay | Admitting: Gastroenterology

## 2020-12-05 LAB — SURGICAL PATHOLOGY

## 2021-03-24 IMAGING — CR DG CHEST 2V
1 series · 2 of 2 positions shown · non-contrast
Comparison: None.

CLINICAL DATA: Initial evaluation for acute chest pain.

EXAM:
CHEST - 2 VIEW

[Series 1: w chest pa · 0.14mm/px · 2 of 2 slices shown]
[im 1/2]
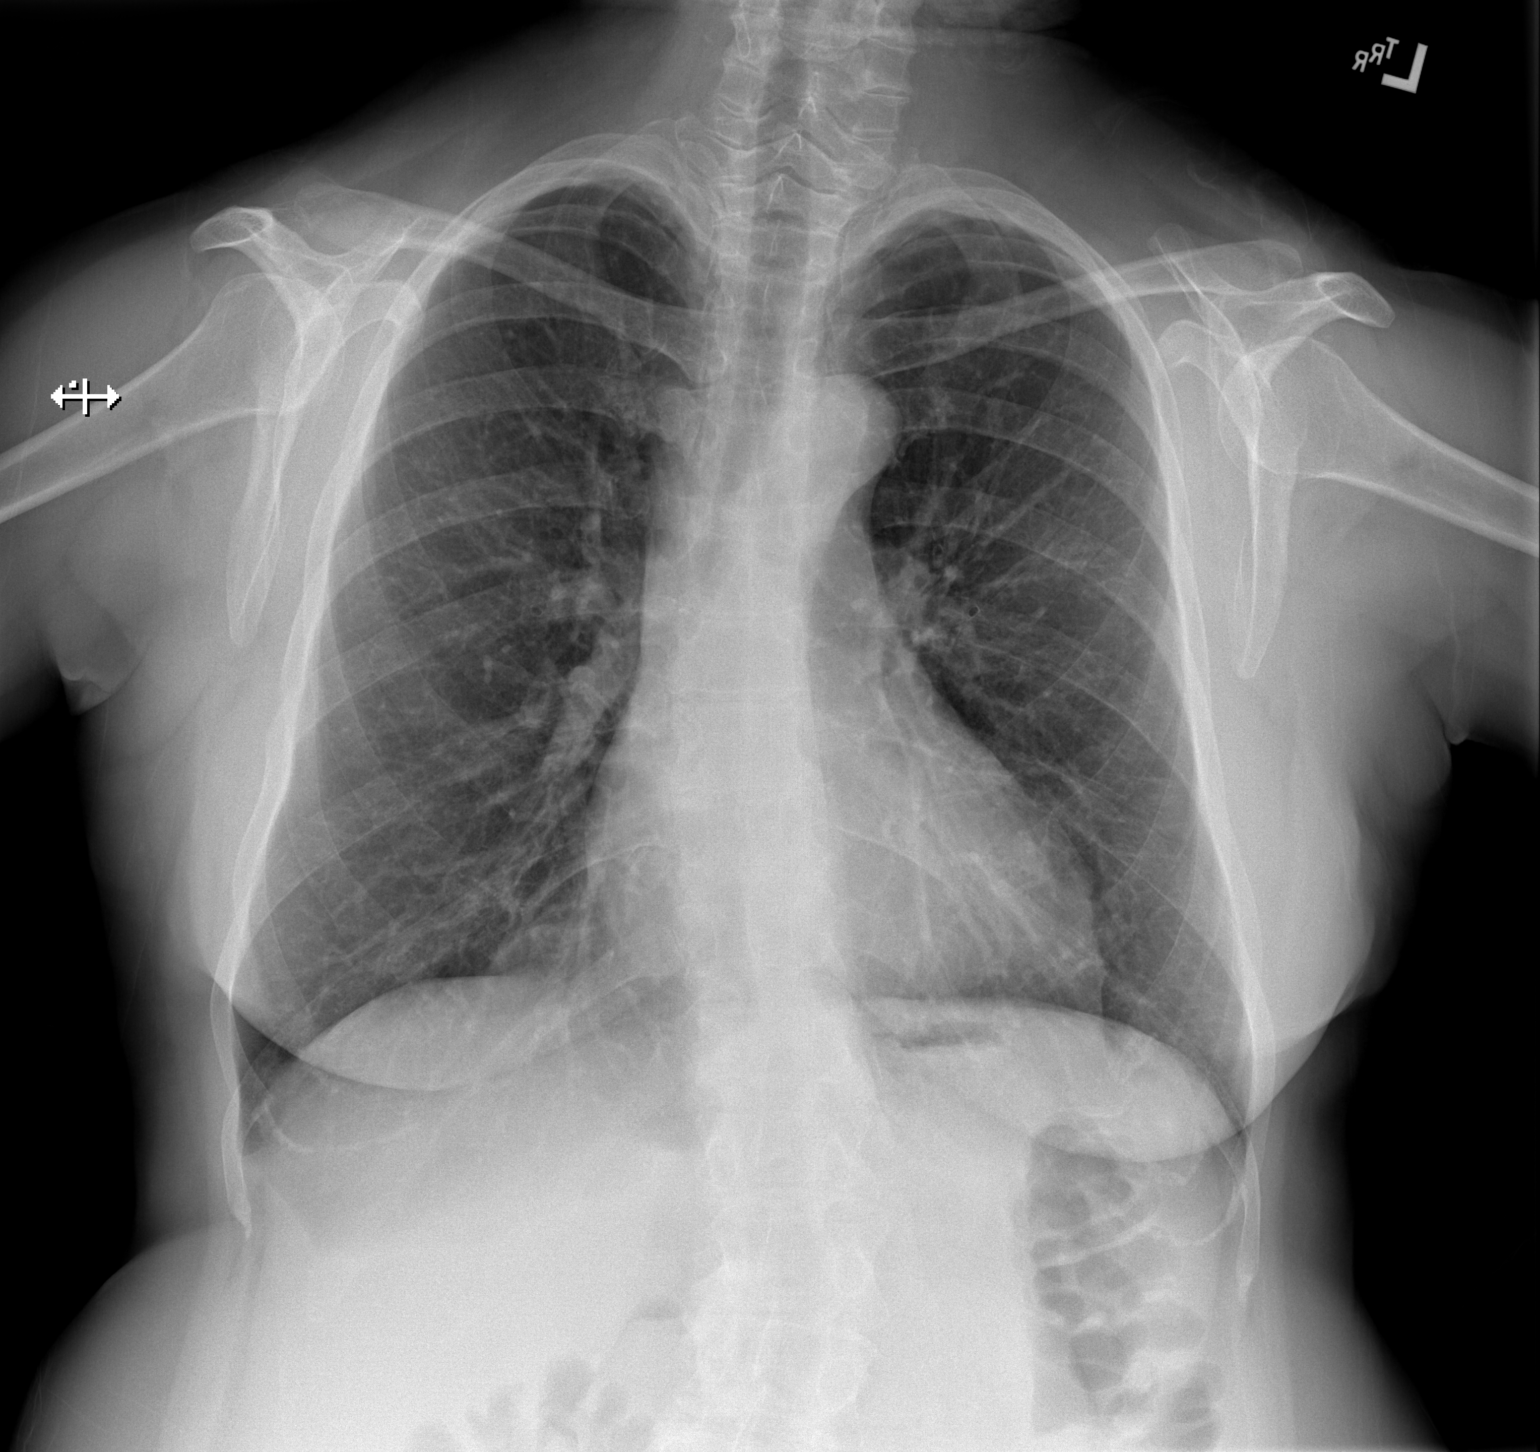
[im 2/2]
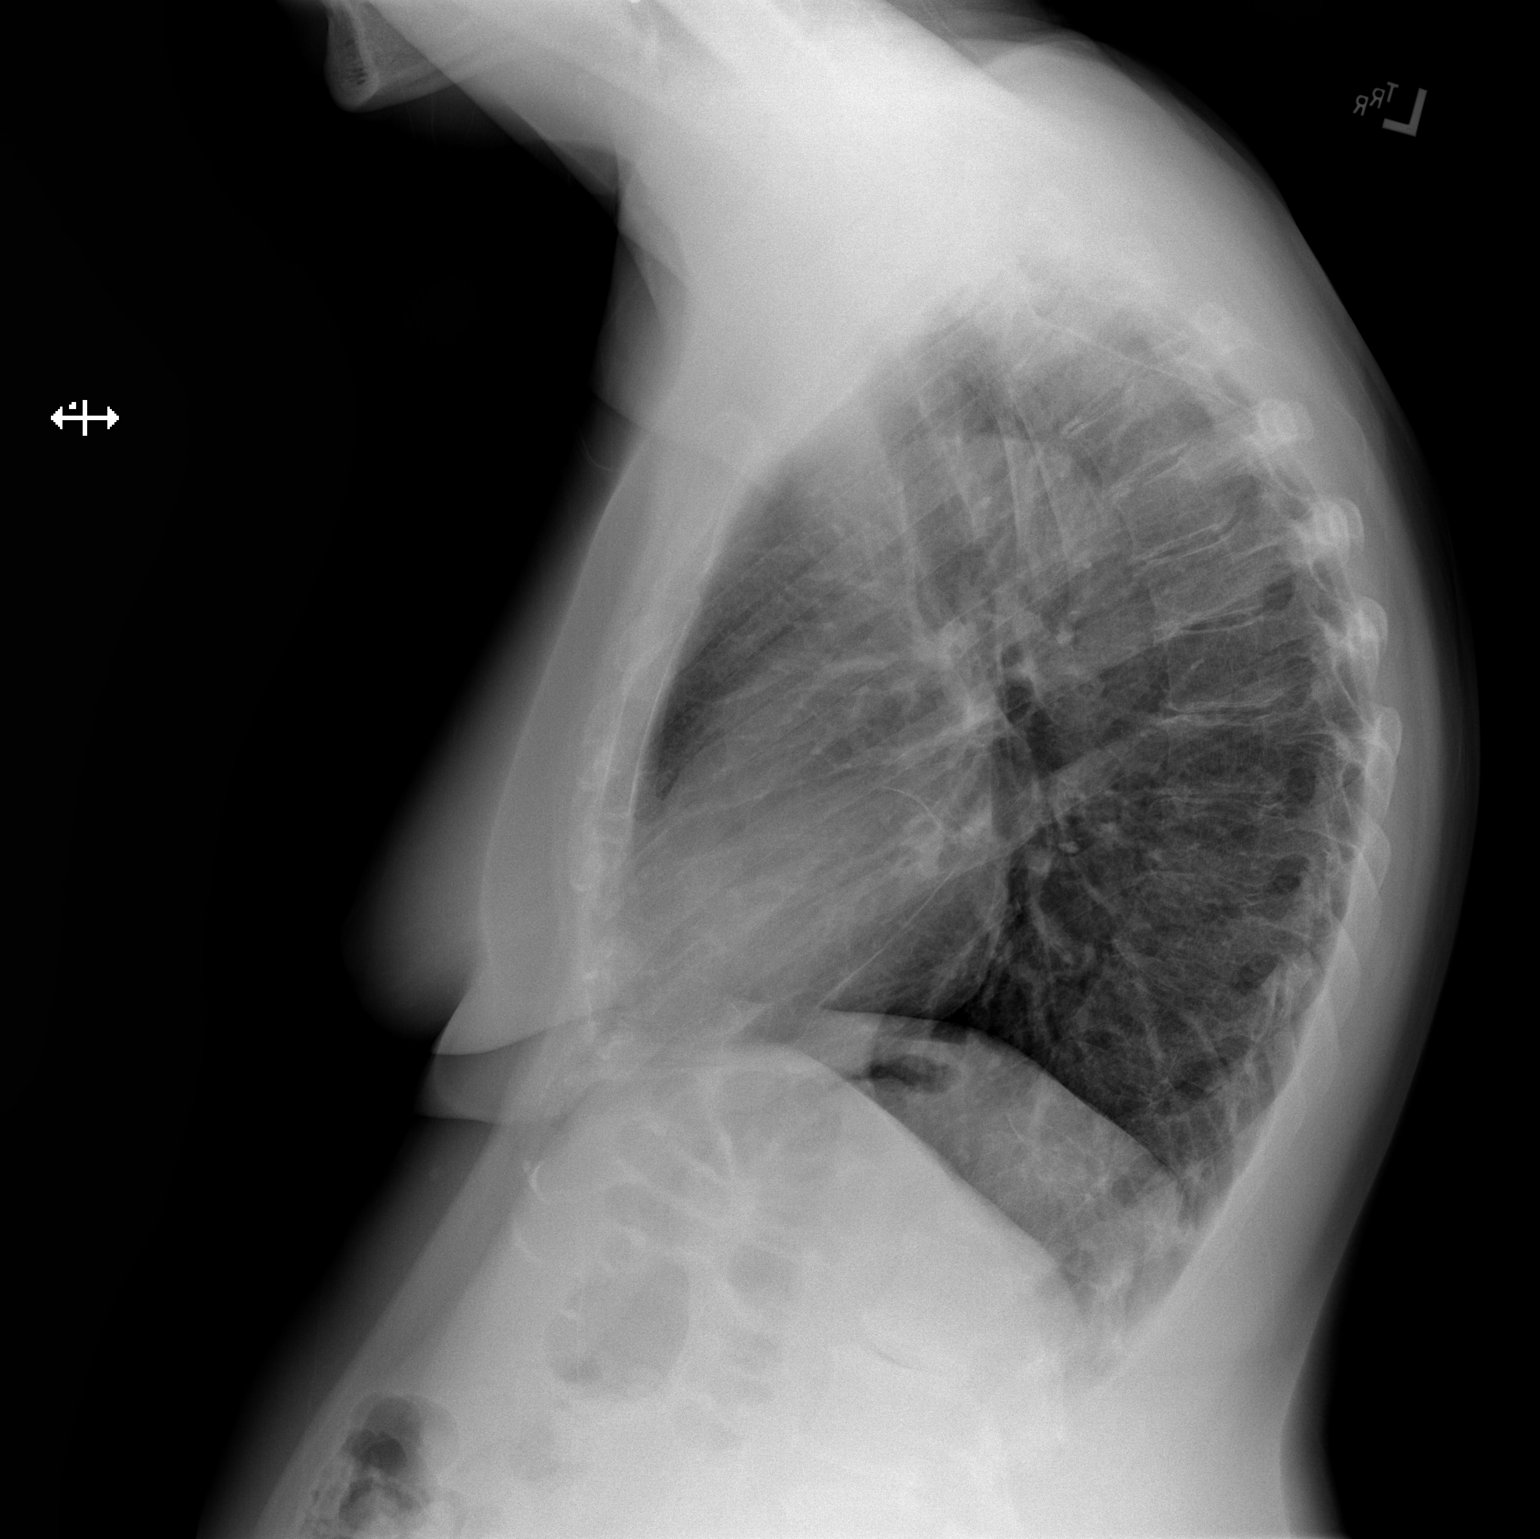

[2 of 2 positions shown; findings below may reference images not displayed]

FINDINGS: The cardiac and mediastinal silhouettes are within normal limits.

The lungs are normally inflated. No airspace consolidation, pleural
effusion, or pulmonary edema. No pneumothorax.

No acute osseous abnormality.
IMPRESSION: No active cardiopulmonary disease.

## 2021-09-18 ENCOUNTER — Emergency Department: Payer: BC Managed Care – PPO

## 2021-09-18 ENCOUNTER — Other Ambulatory Visit: Payer: Self-pay

## 2021-09-18 ENCOUNTER — Emergency Department
Admission: EM | Admit: 2021-09-18 | Discharge: 2021-09-18 | Disposition: A | Discharge status: Home / Self Care | Payer: BC Managed Care – PPO | Attending: Emergency Medicine | Admitting: Emergency Medicine

## 2021-09-18 ENCOUNTER — Encounter: Payer: Self-pay | Admitting: Emergency Medicine

## 2021-09-18 DIAGNOSIS — S3992XA Unspecified injury of lower back, initial encounter: Secondary | ICD-10-CM | POA: Diagnosis present

## 2021-09-18 DIAGNOSIS — X58XXXA Exposure to other specified factors, initial encounter: Secondary | ICD-10-CM | POA: Insufficient documentation

## 2021-09-18 DIAGNOSIS — S39012A Strain of muscle, fascia and tendon of lower back, initial encounter: Secondary | ICD-10-CM | POA: Diagnosis not present

## 2021-09-18 DIAGNOSIS — R109 Unspecified abdominal pain: Secondary | ICD-10-CM | POA: Insufficient documentation

## 2021-09-18 LAB — URINALYSIS, ROUTINE W REFLEX MICROSCOPIC
Bilirubin Urine: NEGATIVE
Glucose, UA: NEGATIVE mg/dL
Hgb urine dipstick: NEGATIVE
Ketones, ur: NEGATIVE mg/dL
Leukocytes,Ua: NEGATIVE
Nitrite: NEGATIVE
Protein, ur: NEGATIVE mg/dL
Specific Gravity, Urine: 1.023 (ref 1.005–1.030)
pH: 7 (ref 5.0–8.0)

## 2021-09-18 LAB — COMPREHENSIVE METABOLIC PANEL
ALT: 21 U/L (ref 0–44)
AST: 21 U/L (ref 15–41)
Albumin: 4.1 g/dL (ref 3.5–5.0)
Alkaline Phosphatase: 90 U/L (ref 38–126)
Anion gap: 7 (ref 5–15)
BUN: 21 mg/dL (ref 8–23)
CO2: 28 mmol/L (ref 22–32)
Calcium: 9.2 mg/dL (ref 8.9–10.3)
Chloride: 104 mmol/L (ref 98–111)
Creatinine, Ser: 0.79 mg/dL (ref 0.44–1.00)
GFR, Estimated: >60 mL/min (ref >60)
Glucose, Bld: 115 mg/dL — ABNORMAL HIGH (ref 70–99)
Potassium: 3.8 mmol/L (ref 3.5–5.1)
Sodium: 139 mmol/L (ref 135–145)
Total Bilirubin: 0.5 mg/dL (ref 0.3–1.2)
Total Protein: 7.7 g/dL (ref 6.5–8.1)

## 2021-09-18 LAB — CBC WITH DIFFERENTIAL/PLATELET
Abs Immature Granulocytes: 0.02 10*3/uL (ref 0.00–0.07)
Basophils Absolute: 0.1 10*3/uL (ref 0.0–0.1)
Basophils Relative: 1 %
Eosinophils Absolute: 0.3 10*3/uL (ref 0.0–0.5)
Eosinophils Relative: 3 %
HCT: 41.8 % (ref 36.0–46.0)
Hemoglobin: 13.5 g/dL (ref 12.0–15.0)
Immature Granulocytes: 0 %
Lymphocytes Relative: 24 %
Lymphs Abs: 1.9 10*3/uL (ref 0.7–4.0)
MCH: 29.5 pg (ref 26.0–34.0)
MCHC: 32.3 g/dL (ref 30.0–36.0)
MCV: 91.5 fL (ref 80.0–100.0)
Monocytes Absolute: 0.8 10*3/uL (ref 0.1–1.0)
Monocytes Relative: 9 %
Neutro Abs: 5.1 10*3/uL (ref 1.7–7.7)
Neutrophils Relative %: 63 %
Platelets: 284 10*3/uL (ref 150–400)
RBC: 4.57 MIL/uL (ref 3.87–5.11)
RDW: 12.7 % (ref 11.5–15.5)
WBC: 8.2 10*3/uL (ref 4.0–10.5)
nRBC: 0 % (ref 0.0–0.2)

## 2021-09-18 MED ORDER — LIDOCAINE 5 % EX PTCH: 1.0000 | MEDICATED_PATCH | Freq: Two times a day (BID) | CUTANEOUS | 0 refills | Status: AC

## 2021-09-18 MED ORDER — LIDOCAINE 5 % EX PTCH
1.0000 | MEDICATED_PATCH | CUTANEOUS | Status: DC
  Administered 2021-09-18: 1 via TRANSDERMAL
  Filled 2021-09-18: qty 1

## 2021-09-18 NOTE — ED Provider Notes (Signed)
? ?[facility] ?Provider Note ? ? ? Event Date/Time  ? First MD Initiated Contact with Patient 09/18/21 0310   ?  (approximate) ? ? ?History  ? ?No chief complaint on file. ? ? ?HPI ? ?Nancy Harrington is a 61 y.o. female with no contributory past medical history who presents for evaluation of about 1 week of pain in her right lower back.  She initially described as flank pain but she specifies it is in the lower part of the right side of her back just above the pelvis.  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  No radiation down the leg and no pain specifically in the spine.  No numbness nor tingling nor weakness in her extremities. ? ?She denies fever, chest pain, shortness of breath, nausea, vomiting, and abdominal pain.  No increased urinary frequency nor pain when she urinates.  No blood visualized in her urine. ?  ? ? ?Physical Exam  ? ?Triage Vital Signs: ?ED Triage Vitals  ?Enc Vitals Group  ?   BP 09/18/21 0105 (!) 152/86  ?   Pulse Rate 09/18/21 0105 76  ?   Resp 09/18/21 0105 18  ?   Temp 09/18/21 0105 98.1 ?F (36.7 ?C)  ?   Temp Source 09/18/21 0105 Oral  ?   SpO2 09/18/21 0105 95 %  ?   Weight 09/18/21 0103 68 kg (150 lb)  ?   Height 09/18/21 0103 1.575 m ('5\' 2"'$ )  ?   Head Circumference --   ?   Peak Flow --   ?   Pain Score 09/18/21 0103 5  ?   Pain Loc --   ?   Pain Edu? --   ?   Excl. in [place]? --   ? ? ?Most recent vital signs: ?Vitals:  ? 09/18/21 0105 09/18/21 0321  ?BP: (!) 152/86 134/87  ?Pulse: 76 73  ?Resp: 18 17  ?Temp: 98.1 ?F (36.7 ?C)   ?SpO2: 95% 95%  ? ? ? ?General: Awake, no distress.  ?CV:  Good peripheral perfusion.  ?Resp:  Normal effort.  Lungs are clear to auscultation bilaterally. ?Abd:  No tenderness to palpation of the abdomen. ?Other:  No right flank tenderness to percussion.  Reproducible tenderness to palpation of the  soft tissues of the lumbar spine.  No L-spine tenderness to palpation. ? ? ?ED Results / Procedures / Treatments  ? ?Labs ?(all labs ordered are listed, but only abnormal results are displayed) ?Labs Reviewed  ?COMPREHENSIVE METABOLIC PANEL - Abnormal; Notable for the following components:  ?    Result Value  ? Glucose, Bld 115 (*)   ? All other components within normal limits  ?URINALYSIS, ROUTINE W REFLEX MICROSCOPIC - Abnormal; Notable for the following components:  ? Color, Urine YELLOW (*)   ? APPearance CLOUDY (*)   ? All other components within normal limits  ?CBC WITH DIFFERENTIAL/PLATELET  ? ? ? ?RADIOLOGY ?I personally reviewed the patient's CT scan of the abdomen and pelvis (renal stone study) and I see no evidence of any acute abnormality including no obstructive uropathy.  The radiologist also did not identify any acute abnormalities. ? ? ? ?PROCEDURES: ? ?Critical Care performed: No ? ?Procedures ? ? ?MEDICATIONS ORDERED IN ED: ?Medications  ?lidocaine (LIDODERM) 5 % 1 patch (1  patch Transdermal Patch Applied 09/18/21 0434)  ? ? ? ?IMPRESSION / MDM / ASSESSMENT AND PLAN / ED COURSE  ?I reviewed the triage vital signs and the nursing notes. ?             ?               ? ?Differential diagnosis includes, but is not limited to, musculoskeletal strain, renal colic, UTI/pyelonephritis, less likely lumbar fracture, discitis, osteomyelitis. ? ?Patient is well-appearing and in no distress.  She says she feels much better when she is sitting or lying down.  Vital signs are stable and within normal limits.  Physical exam is strongly suggestive of musculoskeletal strain in the right lumbar region.  No sciatica, no neurovascular compromise. ? ?Labs ordered in triage focus primarily on possible renal colic and include CMP, urinalysis, CBC with differential, and CT renal stone study. ? ?I reviewed the results.  CBC with differential is within normal limits, urinalysis is reassuring and not indicative of infection,  and CMP is within normal limits. ? ?As documented above I reviewed the CT scan and there is no evidence of obstructive uropathy or other acute abnormality. ? ?I updated the patient and provided reassurance and she agrees with the likely MSK strain diagnosis.  I had my usual customary management discussion and gave her return precautions. ? ? ? ? ?  ? ? ?FINAL CLINICAL IMPRESSION(S) / ED DIAGNOSES  ? ?Final diagnoses:  ?Lumbar strain, initial encounter  ? ? ? ?Rx / DC Orders  ? ?ED Discharge Orders   ? ?      Ordered  ?  lidocaine (LIDODERM) 5 %  Every 12 hours       ? 09/18/21 0442  ? ?  ?  ? ?  ? ? ? ?Note:  This document was prepared using Dragon voice recognition software and may include unintentional dictation errors. ?  ?Hinda Kehr, MD ?09/18/21 [phone] ? ?

## 2021-09-18 NOTE — ED Triage Notes (Signed)
Patient ambulatory to triage with steady gait, without difficulty or distress noted; pt reports since last wk having pain to rt flank radiating around into rt abd; denies any accomp symptoms; denies hx of same ?

## 2021-10-08 ENCOUNTER — Other Ambulatory Visit: Payer: Self-pay | Admitting: Family Medicine

## 2021-10-08 DIAGNOSIS — Z1231 Encounter for screening mammogram for malignant neoplasm of breast: Secondary | ICD-10-CM

## 2021-12-27 ENCOUNTER — Ambulatory Visit: Payer: Self-pay | Admitting: *Deleted

## 2021-12-27 NOTE — Telephone Encounter (Signed)
  Chief Complaint: Numbness Legs Symptoms: Numbness both legs, left ankle swollen Frequency: Right leg numbness 1-2 years, now left. Left foot swollen.CAn ambulate Pertinent Negatives: Patient denies  Disposition: '[]'$ ED /'[]'$ Urgent Care (no appt availability in office) / '[]'$ Appointment(In office/virtual)/ '[]'$  [facility]/ '[]'$ Home Care/ '[]'$ Refused Recommended Disposition /'[]'$  Mobile Bus/ '[x]'$  Follow-up with PCP Additional Notes: Pt states she is beig seen at clinic in [place] [facility] [place], [place]. States given ropinirole to try until 01/15/22 and return to clinic. Pt states she is calling to see if she can take Advil during the day. Advised that would be OK but also advised to use pharmacist as resource or/and call clinic she was referring to. States she will do so. No PCP, Advised to CB if needed. Reason for Disposition  [1] Weakness of arm / hand, or leg / foot AND [2] is a chronic symptom (recurrent or ongoing AND present > 4 weeks)  Answer Assessment - Initial Assessment Questions 1. SYMPTOM: "What is the main symptom you are concerned about?" (e.g., weakness, numbness)     Numbness of legs,  left foot swollen 2. ONSET: "When did this start?" (minutes, hours, days; while sleeping)     Spasms legs, 1-2 years 3. LAST NORMAL: "When was the last time you (the patient) were normal (no symptoms)?"     Numbness started right leg 1 year, now left leg numb 4. PATTERN "Does this come and go, or has it been constant since it started?"  "Is it present now?"     Constant 5. CARDIAC SYMPTOMS: "Have you had any of the following symptoms: chest pain, difficulty breathing, palpitations?"      6. NEUROLOGIC SYMPTOMS: "Have you had any of the following symptoms: headache, dizziness, vision loss, double vision, changes in speech, unsteady on your feet?"      7. OTHER SYMPTOMS: "Do you have any other symptoms?"    PAin in legs  Protocols used: Neurologic Deficit-A-AH

## 2022-04-20 ENCOUNTER — Emergency Department
Admission: EM | Admit: 2022-04-20 | Discharge: 2022-04-20 | Disposition: A | Discharge status: Home / Self Care | Payer: BC Managed Care – PPO | Attending: Emergency Medicine | Admitting: Emergency Medicine

## 2022-04-20 ENCOUNTER — Emergency Department: Payer: BC Managed Care – PPO

## 2022-04-20 ENCOUNTER — Other Ambulatory Visit: Payer: Self-pay

## 2022-04-20 DIAGNOSIS — R1032 Left lower quadrant pain: Secondary | ICD-10-CM | POA: Insufficient documentation

## 2022-04-20 LAB — URINALYSIS, ROUTINE W REFLEX MICROSCOPIC
Bilirubin Urine: NEGATIVE
Glucose, UA: NEGATIVE mg/dL
Hgb urine dipstick: NEGATIVE
Ketones, ur: 5 mg/dL — AB
Leukocytes,Ua: NEGATIVE
Nitrite: NEGATIVE
Protein, ur: NEGATIVE mg/dL
Specific Gravity, Urine: 1.023 (ref 1.005–1.030)
pH: 5 (ref 5.0–8.0)

## 2022-04-20 LAB — CBC
HCT: 43.8 % (ref 36.0–46.0)
Hemoglobin: 14.7 g/dL (ref 12.0–15.0)
MCH: 29.8 pg (ref 26.0–34.0)
MCHC: 33.6 g/dL (ref 30.0–36.0)
MCV: 88.8 fL (ref 80.0–100.0)
Platelets: 310 10*3/uL (ref 150–400)
RBC: 4.93 MIL/uL (ref 3.87–5.11)
RDW: 12.8 % (ref 11.5–15.5)
WBC: 9.7 10*3/uL (ref 4.0–10.5)
nRBC: 0 % (ref 0.0–0.2)

## 2022-04-20 LAB — COMPREHENSIVE METABOLIC PANEL WITH GFR
ALT: 22 U/L (ref 0–44)
AST: 23 U/L (ref 15–41)
Albumin: 4.4 g/dL (ref 3.5–5.0)
Alkaline Phosphatase: 99 U/L (ref 38–126)
Anion gap: 8 (ref 5–15)
BUN: 16 mg/dL (ref 8–23)
CO2: 29 mmol/L (ref 22–32)
Calcium: 9.4 mg/dL (ref 8.9–10.3)
Chloride: 106 mmol/L (ref 98–111)
Creatinine, Ser: 0.89 mg/dL (ref 0.44–1.00)
GFR, Estimated: >60 mL/min
Glucose, Bld: 155 mg/dL — ABNORMAL HIGH (ref 70–99)
Potassium: 3.2 mmol/L — ABNORMAL LOW (ref 3.5–5.1)
Sodium: 143 mmol/L (ref 135–145)
Total Bilirubin: 0.6 mg/dL (ref 0.3–1.2)
Total Protein: 7.9 g/dL (ref 6.5–8.1)

## 2022-04-20 MED ORDER — IOHEXOL 350 MG/ML SOLN
75.0000 mL | Freq: Once | INTRAVENOUS | Status: AC | PRN
  Administered 2022-04-20: 75 mL via INTRAVENOUS

## 2022-04-20 NOTE — ED Provider Notes (Signed)
[facility] Emergency Department Provider Note     Event Date/Time   First MD Initiated Contact with Patient 04/20/22 2207     (approximate)   History   Abdominal Pain   HPI  Nancy Harrington is a 61 y.o. female presents to the ED for evaluation of intermittent left lower quadrant pain for the last 3 weeks.  Patient reports a sensation of straining or pulling when she urinates or defecates.  She denies any anorexia, nausea, vomiting, or bowel changes.  Physical Exam   Triage Vital Signs: ED Triage Vitals  Enc Vitals Group     BP 04/20/22 2144 (!) 155/84     Pulse Rate 04/20/22 2144 85     Resp 04/20/22 2144 18     Temp 04/20/22 2144 98.2 F (36.8 C)     Temp Source 04/20/22 2144 Oral     SpO2 04/20/22 2144 98 %     Weight 04/20/22 2145 152 lb (68.9 kg)     Height 04/20/22 2145 '5\' 1"'$  (1.549 m)     Head Circumference --      Peak Flow --      Pain Score 04/20/22 2145 3     Pain Loc --      Pain Edu? --      Excl. in [place]? --     Most recent vital signs: Vitals:   04/20/22 2144  BP: (!) 155/84  Pulse: 85  Resp: 18  Temp: 98.2 F (36.8 C)  SpO2: 98%    General Awake, no distress. NAD CV:  Good peripheral perfusion.  RESP:  Normal effort.  ABD:  No distention.  Nontender.  No CVA tenderness elicited.  No active bowel sounds appreciated.  No rebound, guarding, or rigidity.   ED Results / Procedures / Treatments   Labs (all labs ordered are listed, but only abnormal results are displayed) Labs Reviewed  URINALYSIS, ROUTINE W REFLEX MICROSCOPIC - Abnormal; Notable for the following components:      Result Value   Color, Urine YELLOW (*)    APPearance CLEAR (*)    Ketones, ur 5 (*)    All other components within normal limits  COMPREHENSIVE METABOLIC PANEL - Abnormal; Notable for the following components:   Potassium 3.2 (*)    Glucose, Bld 155 (*)    All other components within normal limits  CBC     EKG   RADIOLOGY  I  personally viewed and evaluated these images as part of my medical decision making, as well as reviewing the written report by the radiologist.  ED Provider Interpretation: no acute findings  CT ABDOMEN PELVIS W CONTRAST  Result Date: 04/20/2022 CLINICAL DATA:  LLQ abdominal pain EXAM: CT ABDOMEN AND PELVIS WITH CONTRAST TECHNIQUE: Multidetector CT imaging of the abdomen and pelvis was performed using the standard protocol following bolus administration of intravenous contrast. RADIATION DOSE REDUCTION: This exam was performed according to the departmental dose-optimization program which includes automated exposure control, adjustment of the mA and/or kV according to patient size and/or use of iterative reconstruction technique. CONTRAST:  66m OMNIPAQUE IOHEXOL 350 MG/ML SOLN COMPARISON:  None Available. FINDINGS: Lower chest: No acute abnormality. Hepatobiliary: No focal liver abnormality. No gallstones, gallbladder wall thickening, or pericholecystic fluid. No biliary dilatation. Pancreas: No focal lesion. Normal pancreatic contour. No surrounding inflammatory changes. No main pancreatic ductal dilatation. Spleen: Normal in size without focal abnormality. Adrenals/Urinary Tract: No adrenal nodule bilaterally. Bilateral kidneys enhance symmetrically. No hydronephrosis.  No hydroureter. The urinary bladder is unremarkable. Stomach/Bowel: Stomach is within normal limits. No evidence of bowel wall thickening or dilatation. Appendix appears normal. Vascular/Lymphatic: No abdominal aorta or iliac aneurysm. Moderate atherosclerotic plaque of the aorta and its branches. No abdominal, pelvic, or inguinal lymphadenopathy. Reproductive: Uterus and bilateral adnexa are unremarkable. Other: No intraperitoneal free fluid. No intraperitoneal free gas. No organized fluid collection. Musculoskeletal: No abdominal wall hernia or abnormality. No suspicious lytic or blastic osseous lesions. No acute displaced fracture.  Levocurvature of the upper lumbar spine. Retrolisthesis of L1 on L2, L2 on L3, L3 on L4. Grade 1 anterolisthesis of L4 on L5. IMPRESSION: 1. No acute intra-abdominal or intrapelvic abnormality. 2.  Aortic Atherosclerosis (ICD10-I70.0). Electronically Signed   By: Iven Finn M.D.   On: 04/20/2022 23:15     PROCEDURES:  Critical Care performed: No  Procedures   MEDICATIONS ORDERED IN ED: Medications  iohexol (OMNIPAQUE) 350 MG/ML injection 75 mL (75 mLs Intravenous Contrast Given 04/20/22 2304)     IMPRESSION / MDM / ASSESSMENT AND PLAN / ED COURSE  I reviewed the triage vital signs and the nursing notes.                              Differential diagnosis includes, but is not limited to, acute appendicitis, diverticulitis, urinary tract infection/pyelonephritis, endometriosis, bowel obstruction, colitis, renal colic, gastroenteritis, hernia, etc.   Patient's presentation is most consistent with acute complicated illness / injury requiring diagnostic workup.  Patient to the ED for evaluation of medical episodes of left lower abdominal pain over the last 3 to 4 weeks.  Patient without any reports of nausea, vomiting, diarrhea, or constipation.  With a reassuring exam, normal labs without signs of acute leukocytosis, critical anemia, or electrolyte abnormality.  No evidence of UTI on exam.  CT exam does not show any acute intra-abdominal process or etiology for the patient's left lower quadrant pain.  This is based on my interpretation of images and radiology report.  She also denies any anorexia, or febrile illness.  Patient's diagnosis is consistent with abdominal pain of unknown etiology. Patient is to follow up with primary provider or GI medicine as needed or otherwise directed. Patient is given ED precautions to return to the ED for any worsening or new symptoms.     FINAL CLINICAL IMPRESSION(S) / ED DIAGNOSES   Final diagnoses:  Left lower quadrant abdominal pain     Rx /  DC Orders   ED Discharge Orders     None        Note:  This document was prepared using Dragon voice recognition software and may include unintentional dictation errors.    Melvenia Needles, PA-C 04/20/22 2355    Nathaniel Man, MD 04/21/22 1523

## 2022-04-20 NOTE — Discharge Instructions (Addendum)
Your exam, labs, and CT scan are all normal and reassuring at this time.  You should follow-up with your primary provider for Dr. Vicente Males in GI medicine for ongoing concerns.

## 2022-04-20 NOTE — ED Triage Notes (Signed)
Pt c/o LLQ abdominal pain for 3 weeks. Pt states when she tries to urinate or defecate she feels like she's straining/pulling. Pt is AOX4, NAD noted.

## 2022-07-04 ENCOUNTER — Other Ambulatory Visit: Payer: Self-pay | Admitting: Family Medicine

## 2022-07-04 DIAGNOSIS — N644 Mastodynia: Secondary | ICD-10-CM

## 2022-07-05 ENCOUNTER — Ambulatory Visit
Admission: RE | Admit: 2022-07-05 | Discharge: 2022-07-05 | Disposition: A | Discharge status: Home / Self Care | Payer: BC Managed Care – PPO | Source: Ambulatory Visit | Attending: Family Medicine | Admitting: Family Medicine

## 2022-07-05 DIAGNOSIS — N644 Mastodynia: Secondary | ICD-10-CM | POA: Diagnosis not present

## 2022-07-08 ENCOUNTER — Other Ambulatory Visit: Payer: BC Managed Care – PPO

## 2022-10-16 ENCOUNTER — Emergency Department: Payer: Medicaid Other

## 2022-10-16 ENCOUNTER — Emergency Department
Admission: EM | Admit: 2022-10-16 | Discharge: 2022-10-16 | Disposition: A | Discharge status: Home / Self Care | Payer: Medicaid Other | Attending: Emergency Medicine | Admitting: Emergency Medicine

## 2022-10-16 ENCOUNTER — Encounter: Payer: Self-pay | Admitting: Emergency Medicine

## 2022-10-16 ENCOUNTER — Other Ambulatory Visit: Payer: Self-pay

## 2022-10-16 DIAGNOSIS — R1031 Right lower quadrant pain: Secondary | ICD-10-CM | POA: Insufficient documentation

## 2022-10-16 LAB — COMPREHENSIVE METABOLIC PANEL
ALT: 25 U/L (ref 0–44)
AST: 24 U/L (ref 15–41)
Albumin: 4.4 g/dL (ref 3.5–5.0)
Alkaline Phosphatase: 96 U/L (ref 38–126)
Anion gap: 11 (ref 5–15)
BUN: 18 mg/dL (ref 8–23)
CO2: 24 mmol/L (ref 22–32)
Calcium: 9.1 mg/dL (ref 8.9–10.3)
Chloride: 102 mmol/L (ref 98–111)
Creatinine, Ser: 0.94 mg/dL (ref 0.44–1.00)
GFR, Estimated: >60 mL/min (ref >60)
Glucose, Bld: 103 mg/dL — ABNORMAL HIGH (ref 70–99)
Potassium: 3.9 mmol/L (ref 3.5–5.1)
Sodium: 137 mmol/L (ref 135–145)
Total Bilirubin: 0.6 mg/dL (ref 0.3–1.2)
Total Protein: 7.9 g/dL (ref 6.5–8.1)

## 2022-10-16 LAB — CBC WITH DIFFERENTIAL/PLATELET
Abs Immature Granulocytes: 0.03 10*3/uL (ref 0.00–0.07)
Basophils Absolute: 0.1 10*3/uL (ref 0.0–0.1)
Basophils Relative: 1 %
Eosinophils Absolute: 0.1 10*3/uL (ref 0.0–0.5)
Eosinophils Relative: 1 %
HCT: 44 % (ref 36.0–46.0)
Hemoglobin: 14.5 g/dL (ref 12.0–15.0)
Immature Granulocytes: 0 %
Lymphocytes Relative: 16 %
Lymphs Abs: 1.8 10*3/uL (ref 0.7–4.0)
MCH: 30.2 pg (ref 26.0–34.0)
MCHC: 33 g/dL (ref 30.0–36.0)
MCV: 91.7 fL (ref 80.0–100.0)
Monocytes Absolute: 0.7 10*3/uL (ref 0.1–1.0)
Monocytes Relative: 6 %
Neutro Abs: 8.2 10*3/uL — ABNORMAL HIGH (ref 1.7–7.7)
Neutrophils Relative %: 76 %
Platelets: 309 10*3/uL (ref 150–400)
RBC: 4.8 MIL/uL (ref 3.87–5.11)
RDW: 12.6 % (ref 11.5–15.5)
WBC: 10.9 10*3/uL — ABNORMAL HIGH (ref 4.0–10.5)
nRBC: 0 % (ref 0.0–0.2)

## 2022-10-16 LAB — URINALYSIS, ROUTINE W REFLEX MICROSCOPIC
Bilirubin Urine: NEGATIVE
Glucose, UA: NEGATIVE mg/dL
Hgb urine dipstick: NEGATIVE
Ketones, ur: 5 mg/dL — AB
Leukocytes,Ua: NEGATIVE
Nitrite: NEGATIVE
Protein, ur: NEGATIVE mg/dL
Specific Gravity, Urine: 1.021 (ref 1.005–1.030)
pH: 5 (ref 5.0–8.0)

## 2022-10-16 LAB — LIPASE, BLOOD: Lipase: 25 U/L (ref 11–51)

## 2022-10-16 NOTE — ED Triage Notes (Signed)
Patient to ED via POV for abd pain. Patient states pain on the left side for months and on the right side for the past 5 days. Had CT recently that didn't show anything. Denies N/V/D. Last BM this AM.

## 2022-10-16 NOTE — ED Provider Notes (Signed)
[facility] Provider Note    Event Date/Time   First MD Initiated Contact with Patient 10/16/22 2053     (approximate)   History   Chief Complaint: Abdominal Pain   HPI  Nancy Harrington is a 62 y.o. female with no significant past medical history who comes ED complaining of right lower quadrant abdominal pain that is been gradual onset on persistent for the last 5 days.  No fevers chills nausea vomiting diarrhea.  Nonradiating.  No aggravating or relieving factors, eating and drinking okay.  No dysuria or vaginal bleeding.     Physical Exam   Triage Vital Signs: ED Triage Vitals  Enc Vitals Group     BP 10/16/22 1843 (!) 152/86     Pulse Rate 10/16/22 1843 89     Resp 10/16/22 1843 18     Temp 10/16/22 1843 98.6 F (37 C)     Temp Source 10/16/22 1843 Oral     SpO2 10/16/22 1843 96 %     Weight --      Height --      Head Circumference --      Peak Flow --      Pain Score 10/16/22 1844 8     Pain Loc --      Pain Edu? --      Excl. in [place]? --     Most recent vital signs: Vitals:   10/16/22 1843  BP: (!) 152/86  Pulse: 89  Resp: 18  Temp: 98.6 F (37 C)  SpO2: 96%    General: Awake, no distress.  CV:  Good peripheral perfusion.  Resp:  Normal effort.  Abd:  No distention.  Soft with minor right lower quadrant tenderness.  No peritoneal signs Other:  History mucosa   ED Results / Procedures / Treatments   Labs (all labs ordered are listed, but only abnormal results are displayed) Labs Reviewed  COMPREHENSIVE METABOLIC PANEL - Abnormal; Notable for the following components:      Result Value   Glucose, Bld 103 (*)    All other components within normal limits  CBC WITH DIFFERENTIAL/PLATELET - Abnormal; Notable for the following components:   WBC 10.9 (*)    Neutro Abs 8.2 (*)    All other components within normal limits  URINALYSIS, ROUTINE W REFLEX MICROSCOPIC - Abnormal; Notable for the following components:   Color, Urine  YELLOW (*)    APPearance CLEAR (*)    Ketones, ur 5 (*)    All other components within normal limits  LIPASE, BLOOD     EKG    RADIOLOGY CT abdomen pelvis interpreted by me, negative for bowel obstruction or free air.  Radiology report reviewed   PROCEDURES:  Procedures   MEDICATIONS ORDERED IN ED: Medications - No data to display   IMPRESSION / MDM / ASSESSMENT AND PLAN / ED COURSE  I reviewed the triage vital signs and the nursing notes.  DDx: Constipation, appendicitis, ovarian cyst, kidney stone, UTI  Patient's presentation is most consistent with acute presentation with potential threat to life or bodily function.  Patient presents with right lower quadrant pain and tenderness over the last 5 days.  Exam is relatively reassuring, vital signs are normal, labs are unremarkable.  CT scan obtained which shows normal appendix, no acute findings.  Stable for discharge       FINAL CLINICAL IMPRESSION(S) / ED DIAGNOSES   Final diagnoses:  Right lower quadrant abdominal pain  Rx / DC Orders   ED Discharge Orders     None        Note:  This document was prepared using Dragon voice recognition software and may include unintentional dictation errors.   Sharman Cheek, MD 10/16/22 [phone]

## 2022-10-16 NOTE — Discharge Instructions (Addendum)
Your lab tests and CT scan of the abdomen were all okay today.  Keep taking Maalox or Pepcid as needed, and follow-up with primary care for further evaluation of your symptoms.

## 2022-10-16 NOTE — ED Provider Triage Note (Signed)
Emergency Medicine Provider Triage Evaluation Note  Myja Rexing Quiggle , a 62 y.o. female  was evaluated in triage.  Pt complains of abd pain for the past several months. Reports that she feels bloated. No vomiting, no fever, no dysuria. Has had CT in the past at OSH which was negative.  Review of Systems  Positive: Abd pain Negative: Vomiting, dysuria  Physical Exam  BP (!) 152/86 (BP Location: Left Arm)   Pulse 89   Temp 98.6 F (37 C) (Oral)   Resp 18   SpO2 96%  Gen:   Awake, no distress   Resp:  Normal effort  MSK:   Moves extremities without difficulty  Other:    Medical Decision Making  Medically screening exam initiated at 6:45 PM.  Appropriate orders placed.  Romilda Mccrea Schildt was informed that the remainder of the evaluation will be completed by another provider, this initial triage assessment does not replace that evaluation, and the importance of remaining in the ED until their evaluation is complete.     Jackelyn Hoehn, PA-C 10/16/22 [phone]
